# Patient Record
Sex: Female | Born: 1990 | Race: Black or African American | Hispanic: No | Marital: Single | Smoking: Light tobacco smoker
Health system: Southern US, Community
[De-identification: ages and names within clinical notes are randomized; demographics above are authoritative.]

## PROBLEM LIST (undated history)

## (undated) DIAGNOSIS — B019 Varicella without complication: Secondary | ICD-10-CM

## (undated) DIAGNOSIS — E669 Obesity, unspecified: Secondary | ICD-10-CM

## (undated) DIAGNOSIS — S62509A Fracture of unspecified phalanx of unspecified thumb, initial encounter for closed fracture: Secondary | ICD-10-CM

## (undated) DIAGNOSIS — A4902 Methicillin resistant Staphylococcus aureus infection, unspecified site: Secondary | ICD-10-CM

## (undated) HISTORY — DX: Fracture of unspecified phalanx of unspecified thumb, initial encounter for closed fracture: S62.509A

## (undated) HISTORY — DX: Varicella without complication: B01.9

## (undated) HISTORY — DX: Methicillin resistant Staphylococcus aureus infection, unspecified site: A49.02

---
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
## 2011-01-27 LAB — POCT URINALYSIS DIP (DEVICE)
Hgb urine dipstick: NEGATIVE
Protein, ur: 30 — AB
Specific Gravity, Urine: 1.015
Urobilinogen, UA: 1

## 2011-04-14 ENCOUNTER — Ambulatory Visit (INDEPENDENT_AMBULATORY_CARE_PROVIDER_SITE_OTHER): Payer: BC Managed Care – PPO | Admitting: Family Medicine

## 2011-04-14 ENCOUNTER — Encounter: Payer: Self-pay | Admitting: Family Medicine

## 2011-04-14 ENCOUNTER — Telehealth: Payer: Self-pay | Admitting: Internal Medicine

## 2011-04-14 DIAGNOSIS — N912 Amenorrhea, unspecified: Secondary | ICD-10-CM

## 2011-04-14 DIAGNOSIS — G4700 Insomnia, unspecified: Secondary | ICD-10-CM

## 2011-04-14 DIAGNOSIS — D649 Anemia, unspecified: Secondary | ICD-10-CM

## 2011-04-14 LAB — CBC WITH DIFFERENTIAL/PLATELET
Eosinophils Absolute: 0.1 10*3/uL (ref 0.0–0.7)
MCHC: 31.6 g/dL (ref 30.0–36.0)
MCV: 75.1 fl — ABNORMAL LOW (ref 78.0–100.0)
Monocytes Absolute: 0.5 10*3/uL (ref 0.1–1.0)
Neutrophils Relative %: 62 % (ref 43.0–77.0)
Platelets: 427 10*3/uL — ABNORMAL HIGH (ref 150.0–400.0)

## 2011-04-14 LAB — BASIC METABOLIC PANEL
BUN: 10 mg/dL (ref 6–23)
CO2: 26 mEq/L (ref 19–32)
Calcium: 9.1 mg/dL (ref 8.4–10.5)
Creatinine, Ser: 0.7 mg/dL (ref 0.4–1.2)

## 2011-04-14 LAB — POCT URINALYSIS DIPSTICK
Spec Grav, UA: >=1.03
Urobilinogen, UA: 1

## 2011-04-14 LAB — TSH: TSH: 0.81 u[IU]/mL (ref 0.35–5.50)

## 2011-04-14 NOTE — Telephone Encounter (Signed)
Per Dr. Tawanna Cooler pt. Needs to f/u with Dr. Fabian Sharp in one week.

## 2011-04-14 NOTE — Patient Instructions (Signed)
Take one Tylenol PM at bedtime.  No caffeine after 12 noon .  We will get your lab work today.  Set up an office visit next week with Dr. Demetrius Charity
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
## 2011-04-15 LAB — HCG, SERUM, QUALITATIVE: Preg, Serum: NEGATIVE

## 2011-04-17 NOTE — Telephone Encounter (Signed)
pts last ov wellness check with me was in 2010.  Tell her her labs  Are normal except anemia ( she has had )  Need   Treatment and follow up . Unsure but has sometimes  been associated with insomnia also.  Take iron sulfate with vitamin c 250 mg  At the same time twice a day and then plan repeat lab in 3 weeks or so and then office visit . Let us know if this will not work for her.   Labs should be  Fasting insulin level , lipids, LFTS, cbcdiff ,IBC ferritin, vit b12 and folate( dx anemia and amenorrhea and insomnia)  Calendar her periods and bleeding and  bring in to her office visit so I can review.

## 2011-04-18 NOTE — Telephone Encounter (Signed)
Pt aware and orders placed in epic.  

## 2011-05-30 ENCOUNTER — Encounter: Payer: Self-pay | Admitting: Internal Medicine

## 2011-05-30 ENCOUNTER — Ambulatory Visit (INDEPENDENT_AMBULATORY_CARE_PROVIDER_SITE_OTHER): Payer: BC Managed Care – PPO | Admitting: Internal Medicine

## 2011-05-30 VITALS — BP 100/70 | HR 72 | Wt 237.0 lb

## 2011-05-30 DIAGNOSIS — N926 Irregular menstruation, unspecified: Secondary | ICD-10-CM

## 2011-05-30 DIAGNOSIS — G4700 Insomnia, unspecified: Secondary | ICD-10-CM

## 2011-05-30 DIAGNOSIS — E669 Obesity, unspecified: Secondary | ICD-10-CM

## 2011-05-30 DIAGNOSIS — D649 Anemia, unspecified: Secondary | ICD-10-CM

## 2011-05-30 NOTE — Progress Notes (Signed)
  Subjective:    Patient ID: Kaylee Holmes, female    DOB: 1990/06/15, 21 y.o.   MRN: [id number]  HPI PT comes in today for  Fu of visit for insomnia per Dr Tawanna Cooler . She also had labs done and was noted to be anemic. In the meantime taking iron supp with voit c about bid without se. No hx of unusual bleeding  . Period every 3-4 months and last a week.  And somewhat heavy.  Sleep  :  Tired all  Day long but lays down and hard to sleep. No change since on iron.  denies  Anxiety and depression.  NOw in school [employer]   .   For just thhis semester    Minimal screen time  No TV. Except will watch with her mom if she cant sleep.   When cant sleep reads; likes to read. Can fall asleep and then goes to her bed and cant sleep Denies  anxety caffiene etoh or depression. No rls  Review of Systems No cp sob  Cough as per hpi.   Past history family history social history reviewed in the electronic medical record.     Objective:   Physical Exam WDWN in nad looks well Wt Readings from Last 3 Encounters:  05/30/11 237 lb (107.502 kg) (99.13%*)  04/14/11 242 lb (109.77 kg) (99.22%*)  12/29/08 224 lb (101.606 kg) (98.86%*)   * Growth percentiles are based on CDC 2-20 Years data.   wdwn in nad Neck: Supple without adenopathy or masses or bruits. Oriented x 3. Normal cognition, attention, speech. Not anxious or depressed appearing   Good eye contact . Neuro non focal   Lab Results  Component Value Date   WBC 8.6 04/14/2011   HGB 11.6 05/30/2011   HCT 33.6* 04/14/2011   PLT 427.0* 04/14/2011   GLUCOSE 92 04/14/2011   CHOL 149 11/06/2008   TRIG 75 11/06/2008   HDL 35 11/06/2008   LDLCALC 99 11/06/2008   ALT 10 11/06/2008   AST 18 11/06/2008   NA 142 04/14/2011   K 4.4 04/14/2011   CL 108 04/14/2011   CREATININE 0.7 04/14/2011   BUN 10 04/14/2011   CO2 26 04/14/2011   TSH 0.81 04/14/2011    Lab Results  Component Value Date   HGB 11.6 05/30/2011         Assessment & Plan:  Anemia  Presumed iron deficiency   doidnt get labs ahead of time like discussed ireg menses  Insomnia sleep phase? Other problem no obv anx and depression  Total visit > 50% spent counseling and coordinating care   Hand out from UP TO DATE and ehr and review given as above  Then plan fu after  labs in  2 months .  Perhaps reg sced at school will help cycling.  Total visit > 50% spent counseling and coordinating care  Reviewing data with patient

## 2011-05-30 NOTE — Patient Instructions (Addendum)
Avoid back lighting at least an hour before  tryig to go to sleep. The body likes a 24 to 25 hours sleep cycle and  Light and dark influence this.   Could try melatonin if needed.   Some studies show that  This could put your Brain the in right stage for sleep.   Insomnia Insomnia is frequent trouble falling and/or staying asleep. Insomnia can be a long term problem or a short term problem. Both are common. Insomnia can be a short term problem when the wakefulness is related to a certain stress or worry. Long term insomnia is often related to ongoing stress during waking hours and/or poor sleeping habits. Overtime, sleep deprivation itself can make the problem worse. Every little thing feels more severe because you are overtired and your ability to cope is decreased. CAUSES   Stress, anxiety, and depression.   Poor sleeping habits.   Distractions such as TV in the bedroom.   Naps close to bedtime.   Engaging in emotionally charged conversations before bed.   Technical reading before sleep.   Alcohol and other sedatives. They may make the problem worse. They can hurt normal sleep patterns and normal dream activity.   Stimulants such as caffeine for several hours prior to bedtime.   Pain syndromes and shortness of breath can cause insomnia.   Exercise late at night.   Changing time zones may cause sleeping problems (jet lag).  It is sometimes helpful to have someone observe your sleeping patterns. They should look for periods of not breathing during the night (sleep apnea). They should also look to see how long those periods last. If you live alone or observers are uncertain, you can also be observed at a sleep clinic where your sleep patterns will be professionally monitored. Sleep apnea requires a checkup and treatment. Give your caregivers your medical history. Give your caregivers observations your family has made about your sleep.  SYMPTOMS   Not feeling rested in the morning.     Anxiety and restlessness at bedtime.   Difficulty falling and staying asleep.  TREATMENT   Your caregiver may prescribe treatment for an underlying medical disorders. Your caregiver can give advice or help if you are using alcohol or other drugs for self-medication. Treatment of underlying problems will usually eliminate insomnia problems.   Medications can be prescribed for short time use. They are generally not recommended for lengthy use.   Over-the-counter sleep medicines are not recommended for lengthy use. They can be habit forming.   You can promote easier sleeping by making lifestyle changes such as:   Using relaxation techniques that help with breathing and reduce muscle tension.   Exercising earlier in the day.   Changing your diet and the time of your last meal. No night time snacks.   Establish a regular time to go to bed.   Counseling can help with stressful problems and worry.   Soothing music and white noise may be helpful if there are background noises you cannot remove.   Stop tedious detailed work at least one hour before bedtime.  HOME CARE INSTRUCTIONS   Keep a diary. Inform your caregiver about your progress. This includes any medication side effects. See your caregiver regularly. Take note of:   Times when you are asleep.   Times when you are awake during the night.   The quality of your sleep.   How you feel the next day.  This information will help your caregiver care for you.  Get out of bed if you are still awake after 15 minutes. Read or do some quiet activity. Keep the lights down. Wait until you feel sleepy and go back to bed.   Keep regular sleeping and waking hours. Avoid naps.   Exercise regularly.   Avoid distractions at bedtime. Distractions include watching television or engaging in any intense or detailed activity like attempting to balance the household checkbook.   Develop a bedtime ritual. Keep a familiar routine of bathing,  brushing your teeth, climbing into bed at the same time each night, listening to soothing music. Routines increase the success of falling to sleep faster.   Use relaxation techniques. This can be using breathing and muscle tension release routines. It can also include visualizing peaceful scenes. You can also help control troubling or intruding thoughts by keeping your mind occupied with boring or repetitive thoughts like the old concept of counting sheep. You can make it more creative like imagining planting one beautiful flower after another in your backyard garden.   During your day, work to eliminate stress. When this is not possible use some of the previous suggestions to help reduce the anxiety that accompanies stressful situations.  MAKE SURE YOU:   Understand these instructions.   Will watch your condition.   Will get help right away if you are not doing well or get worse.  Document Released: 04/21/2000 Document Revised: 01/04/2011 Document Reviewed: 05/22/2007 [facility] Patient Information 2012 [place], [place].   YOur anemia is improving , Continue the iron twice a day Keep a sleep log as we discussed   >ROV in 2 months or as needed.

## 2011-06-01 ENCOUNTER — Encounter: Payer: Self-pay | Admitting: Internal Medicine

## 2011-06-30 ENCOUNTER — Other Ambulatory Visit: Payer: BC Managed Care – PPO

## 2011-07-05 ENCOUNTER — Other Ambulatory Visit (INDEPENDENT_AMBULATORY_CARE_PROVIDER_SITE_OTHER): Payer: Managed Care, Other (non HMO)

## 2011-07-05 DIAGNOSIS — D649 Anemia, unspecified: Secondary | ICD-10-CM

## 2011-07-05 DIAGNOSIS — G4700 Insomnia, unspecified: Secondary | ICD-10-CM

## 2011-07-05 DIAGNOSIS — N912 Amenorrhea, unspecified: Secondary | ICD-10-CM

## 2011-07-05 LAB — CBC WITH DIFFERENTIAL/PLATELET
Basophils Relative: 0.3 % (ref 0.0–3.0)
Eosinophils Relative: 0.8 % (ref 0.0–5.0)
Lymphocytes Relative: 36.8 % (ref 12.0–46.0)
Monocytes Relative: 4.6 % (ref 3.0–12.0)
Neutrophils Relative %: 57.5 % (ref 43.0–77.0)
RBC: 3.76 Mil/uL — ABNORMAL LOW (ref 3.87–5.11)
WBC: 7.6 10*3/uL (ref 4.5–10.5)

## 2011-07-05 LAB — LIPID PANEL
LDL Cholesterol: 100 mg/dL — ABNORMAL HIGH (ref 0–99)
Total CHOL/HDL Ratio: 4
VLDL: 14.8 mg/dL (ref 0.0–40.0)

## 2011-07-05 LAB — HEPATIC FUNCTION PANEL
AST: 19 U/L (ref 0–37)
Albumin: 3.8 g/dL (ref 3.5–5.2)
Alkaline Phosphatase: 73 U/L (ref 39–117)
Bilirubin, Direct: 0 mg/dL (ref 0.0–0.3)
Total Bilirubin: 0.3 mg/dL (ref 0.3–1.2)

## 2011-07-05 LAB — IBC PANEL
Iron: 15 ug/dL — ABNORMAL LOW (ref 42–145)
Saturation Ratios: 4.1 % — ABNORMAL LOW (ref 20.0–50.0)
Transferrin: 263 mg/dL (ref 212.0–360.0)

## 2011-07-05 LAB — FOLATE: Folate: 6.7 ng/mL (ref >5.9)

## 2011-07-05 LAB — VITAMIN B12: Vitamin B-12: 341 pg/mL (ref 211–911)

## 2011-07-06 LAB — INSULIN, FASTING: Insulin fasting, serum: 34 u[IU]/mL — ABNORMAL HIGH (ref 3–28)

## 2011-07-07 ENCOUNTER — Ambulatory Visit (INDEPENDENT_AMBULATORY_CARE_PROVIDER_SITE_OTHER): Payer: Managed Care, Other (non HMO) | Admitting: Internal Medicine

## 2011-07-07 ENCOUNTER — Encounter: Payer: Self-pay | Admitting: Internal Medicine

## 2011-07-07 ENCOUNTER — Ambulatory Visit: Payer: BC Managed Care – PPO | Admitting: Internal Medicine

## 2011-07-07 DIAGNOSIS — E669 Obesity, unspecified: Secondary | ICD-10-CM

## 2011-07-07 DIAGNOSIS — D649 Anemia, unspecified: Secondary | ICD-10-CM

## 2011-07-07 DIAGNOSIS — N946 Dysmenorrhea, unspecified: Secondary | ICD-10-CM

## 2011-07-07 DIAGNOSIS — N926 Irregular menstruation, unspecified: Secondary | ICD-10-CM

## 2011-07-07 DIAGNOSIS — E8881 Metabolic syndrome: Secondary | ICD-10-CM

## 2011-07-07 DIAGNOSIS — R7309 Other abnormal glucose: Secondary | ICD-10-CM

## 2011-07-07 DIAGNOSIS — G4700 Insomnia, unspecified: Secondary | ICD-10-CM

## 2011-07-07 NOTE — Progress Notes (Signed)
  Subjective:    Patient ID: Kaylee Holmes, female    DOB: 03-11-1991, 21 y.o.   MRN: [id number]  HPI Patient comes in today for follow up of  multiple medical issues.  Anemia; taking iron with vit d bid   . Periods; had heavy on Feb 10 lasted 5 days  Irregular see last note Sleep ; About the same. Wakening  At times.     Hours  Later.  Still hard to fall asleep.  At time.  School tolerable  No new sx  Diet  No soda but ocass gingerale   Juice with meals otherwise water. Review of Systems Neg cp sob fever syncope other bleeding .    Past history family history social history reviewed in the electronic medical record.     Objective:   Physical Exam Well-developed well-nourished in no acute distress Neck: Supple without adenopathy tenderness bruits Abdomen:  Sof,t normal bowel sounds without hepatosplenomegaly, no guarding rebound or masses no CVA tenderness Chest:  Clear to A&P without wheezes rales or rhonchi CV:  S1-S2 no gallops or murmurs peripheral perfusion is normal  Skin on striae acne Lab Results  Component Value Date   WBC 7.6 07/05/2011   HGB 9.4* 07/05/2011   HCT 29.6* 07/05/2011   PLT 423.0* 07/05/2011   GLUCOSE 92 04/14/2011   CHOL 149 07/05/2011   TRIG 74.0 07/05/2011   HDL 34.10* 07/05/2011   LDLCALC 100* 07/05/2011   ALT 12 07/05/2011   AST 19 07/05/2011   NA 142 04/14/2011   K 4.4 04/14/2011   CL 108 04/14/2011   CREATININE 0.7 04/14/2011   BUN 10 04/14/2011   CO2 26 04/14/2011   TSH 0.81 04/14/2011   INsulin levels  34   Low IBC.        Assessment & Plan:  Anemia    Worse   Taking iron bid     Increase    To tid  Lab c/w iron deficiency . Irreg periods and heavy when comes   Insulin resistance  Poss pco syndrome Low HDL Obesity Disc with loss  . As important.   Challenge her to stop juices any sweet beverages altogether although she's doing pretty well with this otherwise. Consideration of metformin and/or hormonal therapy. We will get gynecologic referral  opinion about intervention. Have her followup with me in 3 months  Insomnia  uncertain factors except previously noted.

## 2011-07-07 NOTE — Patient Instructions (Addendum)
Your anemia is a bit worse.  Take iron twice to 3 x per day  a day with vitamin c pills 250 mg .  You have some insulin resistance that can be seen inf prediabetes and or Polycystic ovary syndrome.  Would like to see   A gynecologist   Consider meds such as metformin or hormonal treatments.  Weight loss will help the above problem  also.   It is possible that anemia could contribute to the sleep issue but stress can also .Polycystic Ovarian Syndrome Polycystic ovarian syndrome is a condition with a number of problems. One problem is with the ovaries. The ovaries are organs located in the female pelvis, on each side of the uterus. Usually, during the menstrual cycle, an egg is released from 1 ovary every month. This is called ovulation. When the egg is fertilized, it goes into the womb (uterus), which allows for the growth of a baby. The egg travels from the ovary through the fallopian tube to the uterus. The ovaries also make the hormones estrogen and progesterone. These hormones help the development of a woman's breasts, body shape, and body hair. They also regulate the menstrual cycle and pregnancy. Sometimes, cysts form in the ovaries. A cyst is a fluid-filled sac. On the ovary, different types of cysts can form. The most common type of ovarian cyst is called a functional or ovulation cyst. It is normal, and often forms during the normal menstrual cycle. Each month, a woman's ovaries grow tiny cysts that hold the eggs. When an egg is fully grown, the sac breaks open. This releases the egg. Then, the sac which released the egg from the ovary dissolves. In one type of functional cyst, called a follicle cyst, the sac does not break open to release the egg. It may actually continue to grow. This type of cyst usually disappears within 1 to 3 months.  One type of cyst problem with the ovaries is called Polycystic Ovarian Syndrome (PCOS). In this condition, many follicle cysts form, but do not rupture and  produce an egg. This health problem can affect the following:  Menstrual cycle.   Heart.   Obesity.   Cancer of the uterus.   Fertility.   Blood vessels.   Hair growth (face and body) or baldness.   Hormones.   Appearance.   High blood pressure.   Stroke.   Insulin production.   Inflammation of the liver.   Elevated blood cholesterol and triglycerides.  CAUSES   No one knows the exact cause of PCOS.   Women with PCOS often have a mother or sister with PCOS. There is not yet enough proof to say this is inherited.   Many women with PCOS have a weight problem.   Researchers are looking at the relationship between PCOS and the body's ability to make insulin. Insulin is a hormone that regulates the change of sugar, starches, and other food into energy for the body's use, or for storage. Some women with PCOS make too much insulin. It is possible that the ovaries react by making too many female hormones, called androgens. This can lead to acne, excessive hair growth, weight gain, and ovulation problems.   Too much production of luteinizing hormone (LH) from the pituitary gland in the brain stimulates the ovary to produce too much female hormone (androgen).  SYMPTOMS   Infrequent or no menstrual periods, and/or irregular bleeding.   Inability to get pregnant (infertility), because of not ovulating.   Increased growth of  hair on the face, chest, stomach, back, thumbs, thighs, or toes.   Acne, oily skin, or dandruff.   Pelvic pain.   Weight gain or obesity, usually carrying extra weight around the waist.   Type 2 diabetes (this is the diabetes that usually does not need insulin).   High cholesterol.   High blood pressure.   Female-pattern baldness or thinning hair.   Patches of thickened and dark brown or black skin on the neck, arms, breasts, or thighs.   Skin tags, or tiny excess flaps of skin, in the armpits or neck area.   Sleep apnea (excessive snoring and  breathing stops at times while asleep).   Deepening of the voice.   Gestational diabetes when pregnant.   Increased risk of miscarriage with pregnancy.  DIAGNOSIS  There is no single test to diagnose PCOS.   Your caregiver will:   Take a medical history.   Perform a pelvic exam.   Perform an ultrasound.   Check your female and female hormone levels.   Measure glucose or sugar levels in the blood.   Do other blood tests.   If you are producing too many female hormones, your caregiver will make sure it is from PCOS. At the physical exam, your caregiver will want to evaluate the areas of increased hair growth. Try to allow natural hair growth for a few days before the visit.   During a pelvic exam, the ovaries may be enlarged or swollen by the increased number of small cysts. This can be seen more easily by vaginal ultrasound or screening, to examine the ovaries and lining of the uterus (endometrium) for cysts. The uterine lining may become thicker, if there has not been a regular period.  TREATMENT  Because there is no cure for PCOS, it needs to be managed to prevent problems. Treatments are based on your symptoms. Treatment is also based on whether you want to have a baby or whether you need contraception.  Treatment may include:  Progesterone hormone, to start a menstrual period.   Birth control pills, to make you have regular menstrual periods.   Medicines to make you ovulate, if you want to get pregnant.   Medicines to control your insulin.   Medicine to control your blood pressure.   Medicine and diet, to control your high cholesterol and triglycerides in your blood.   Surgery, making small holes in the ovary, to decrease the amount of female hormone production. This is done through a long, lighted tube (laparoscope), placed into the pelvis through a tiny incision in the lower abdomen.  Your caregiver will go over some of the choices with you. WOMEN WITH PCOS HAVE THESE  CHARACTERISTICS:  High levels of female hormones called androgens.   An irregular or no menstrual cycle.   May have many small cysts in their ovaries.  PCOS is the most common hormonal reproductive problem in women of childbearing age. WHY DO WOMEN WITH PCOS HAVE TROUBLE WITH THEIR MENSTRUAL CYCLE? Each month, about 20 eggs start to mature in the ovaries. As one egg grows and matures, the follicle breaks open to release the egg, so it can travel through the fallopian tube for fertilization. When the single egg leaves the follicle, ovulation takes place. In women with PCOS, the ovary does not make all of the hormones it needs for any of the eggs to fully mature. They may start to grow and accumulate fluid, but no one egg becomes large enough. Instead, some may remain  as cysts. Since no egg matures or is released, ovulation does not occur and the hormone progesterone is not made. Without progesterone, a woman's menstrual cycle is irregular or absent. Also, the cysts produce female hormones, which continue to prevent ovulation.  Document Released: 08/18/2004 Document Revised: 01/04/2011 Document Reviewed: 03/12/2009 [facility] Patient Information 2012 [place], [place].Insulin Resistance Glucose (type of sugar) levels are controlled by a hormone called insulin. Insulin is made by your pancreas. When your blood glucose goes up, insulin is released into your blood. Insulin is required for your body to function normally. However, your body can become resistant your own insulin or to insulin given to treat diabetes. In either case, insulin resistance can lead to serious problems. Taken together, these problems are called Insulin Resistance Syndrome, Metabolic Syndrome, Syndrome X, or Dysmetabolic Syndrome. These problems include:  Diabetes Mellitus Type 2.   Heart disease.   High blood pressure.   Stroke.   Polycystic ovary syndrome.   Fatty liver.  CAUSES  Insulin resistance can develop for many  different reasons. It is more likely to happen in people with these conditions or characteristics:  Obesity.   Pregnancy. This includes gestational diabetes (temporary diabetes during pregnancy).   Family history of Diabetes Type 2.   High blood pressure.   Stress of severe illness.   Corticosteroid treatment.   Polycystic ovarian syndrome.   Non-Caucasion race.   Cardiovascular disease.   A darkening and thickening of the skin in fold areas such as the neckline and armpit (acanthosis nigricans).  SYMPTOMS  There are no symptoms. You may have symptoms related to the various complications of insulin resistance.  DIAGNOSIS  Several different things can make your caregiver suspect you have insulin resistance,. These include:  High blood glucose.   Abnormal cholesterol levels.   High uric acid levels.   Changes related to blood pressure.   Changes related to inflammation.   Presence of acanthosis nigricans (see above).  Insulin resistance can be determined with blood tests. An elevated insulin level when you have not eaten might suggest resistance. Other more complicated tests are sometimes necessary. TREATMENT  Lifestyle changes are the most important treatment for insulin resistance.   If you are overweight and you have insulin resistance, you can improve your insulin sensitivity by loosing 10 to 15 % of your weight.   Moderate exercise for 30 to 40 minutes, 4 days a week, can improve insulin sensitivity.  Some medications can also help improve your insulin sensitivity. Your caregiver can discuss these with you if they are appropriate.  HOME CARE INSTRUCTIONS   Do not smoke.   Keep your weight at a healthy level.   Get exercise.   If you have diabetes, follow your caregiver's directions.   If you have high blood pressure, follow your caregiver's directions.   Take prescribed medications according to the directions.  SEEK MEDICAL CARE IF:   You are diabetic,  and you are having problems keeping your blood sugar at target range.   You are having episodes of hypoglycemia.   You feel you might be having side effects from your medicines.   You have symptoms of an illness that is not improving after 3 to 4 days.   You have a sore or wound that is not healing.   You notice a change in vision or a new problem with your vision.   You develop fever of more than 100.5 F (38.1 C).  SEEK IMMEDIATE MEDICAL CARE IF:   Your  blood sugar goes below 70, especially if you have confusion, lightheadedness or other symptoms with it.   Your blood sugar is very high (as advised by your caregiver) twice in a row.   An unexplained oral temperature above 102.0 F (38.9 C) develops.   You pass out.   You have chest pain and/or trouble breathing.   You have a sudden, severe headache   You have sudden weakness in one arm and/or one leg.   You have sudden difficulty speaking and/or swallowing.   You develop vomiting and/or diarrhea that is getting worse or not improving after 1 day.  Document Released: 06/13/2005 Document Revised: 01/04/2011 Document Reviewed: 06/11/2008 [facility] Patient Information 2012 [place], [place].

## 2011-07-10 ENCOUNTER — Ambulatory Visit: Payer: Self-pay | Admitting: Internal Medicine

## 2011-08-14 ENCOUNTER — Telehealth: Payer: Self-pay | Admitting: Internal Medicine

## 2011-08-14 NOTE — Telephone Encounter (Signed)
Pls advise.  

## 2011-08-14 NOTE — Telephone Encounter (Signed)
Suggest we do OV here and  Obtain  GYNE note and any labs that they did . Before the visit so I can review.   (Already know  that she has insulin resistance with  a prediabetic state. )

## 2011-08-14 NOTE — Telephone Encounter (Signed)
Pts mom called and said that her daughter was dx with diabetes by pts obgyn, Dr Ezzard Standing. Pt is suppose to be referred to an Endocrinologists, but pts mom wants to know if Dr Fabian Sharp could be her attending physician for this matter? If so, pt has ov to see Dr Fabian Sharp in June and pts mom is wondering if pt needs to get in sooner than this?

## 2011-08-15 NOTE — Telephone Encounter (Signed)
Pt aware.

## 2011-08-18 ENCOUNTER — Ambulatory Visit (INDEPENDENT_AMBULATORY_CARE_PROVIDER_SITE_OTHER): Payer: BC Managed Care – PPO | Admitting: Internal Medicine

## 2011-08-18 ENCOUNTER — Encounter: Payer: Self-pay | Admitting: Internal Medicine

## 2011-08-18 VITALS — BP 114/80 | HR 100 | Temp 98.5°F | Wt 241.0 lb

## 2011-08-18 DIAGNOSIS — E8881 Metabolic syndrome: Secondary | ICD-10-CM

## 2011-08-18 DIAGNOSIS — E669 Obesity, unspecified: Secondary | ICD-10-CM

## 2011-08-18 DIAGNOSIS — N926 Irregular menstruation, unspecified: Secondary | ICD-10-CM

## 2011-08-18 DIAGNOSIS — Z68.41 Body mass index (BMI) pediatric, greater than or equal to 95th percentile for age: Secondary | ICD-10-CM

## 2011-08-18 DIAGNOSIS — D649 Anemia, unspecified: Secondary | ICD-10-CM

## 2011-08-18 DIAGNOSIS — Z6841 Body Mass Index (BMI) 40.0 and over, adult: Secondary | ICD-10-CM

## 2011-08-18 LAB — CBC WITH DIFFERENTIAL/PLATELET
Basophils Relative: 0.5 % (ref 0.0–3.0)
Eosinophils Relative: 1.3 % (ref 0.0–5.0)
HCT: 34 % — ABNORMAL LOW (ref 36.0–46.0)
Hemoglobin: 10.8 g/dL — ABNORMAL LOW (ref 12.0–15.0)
Lymphs Abs: 3 10*3/uL (ref 0.7–4.0)
MCV: 79 fl (ref 78.0–100.0)
Monocytes Absolute: 0.4 10*3/uL (ref 0.1–1.0)
Monocytes Relative: 4.9 % (ref 3.0–12.0)
RBC: 4.3 Mil/uL (ref 3.87–5.11)
WBC: 7.7 10*3/uL (ref 4.5–10.5)

## 2011-08-18 LAB — BASIC METABOLIC PANEL
CO2: 27 mEq/L (ref 19–32)
GFR: 171.34 mL/min (ref >60.00)
Glucose, Bld: 94 mg/dL (ref 70–99)
Potassium: 4.7 mEq/L (ref 3.5–5.1)
Sodium: 141 mEq/L (ref 135–145)

## 2011-08-18 LAB — HEMOGLOBIN A1C: Hgb A1c MFr Bld: 5.4 % (ref 4.6–6.5)

## 2011-08-18 MED ORDER — METFORMIN HCL ER 500 MG PO TB24: 500.0000 mg | ORAL_TABLET | Freq: Every day | ORAL | Status: DC

## 2011-08-18 NOTE — Patient Instructions (Addendum)
Stop all sugar or sweet beverages including juices. Will arrange a nutrition referral  Can begin metformin 500 once a day. Have a small protein breakfast  .    Check sugars at least 3 x per week fasting and 3 x per week 2-3 hours after a meal and record.  Continue iron until further notice   Plan ROV depending on labs or 3 months

## 2011-08-19 ENCOUNTER — Encounter: Payer: Self-pay | Admitting: Internal Medicine

## 2011-08-19 DIAGNOSIS — Z68.41 Body mass index (BMI) pediatric, greater than or equal to 95th percentile for age: Secondary | ICD-10-CM | POA: Insufficient documentation

## 2011-08-19 DIAGNOSIS — IMO0002 Reserved for concepts with insufficient information to code with codable children: Secondary | ICD-10-CM | POA: Insufficient documentation

## 2011-08-19 NOTE — Progress Notes (Signed)
  Subjective:    Patient ID: Kaylee Holmes, female    DOB: 1990/09/07, 21 y.o.   MRN: [id number]  HPI Comes in today with mom because gyne said she had dm and wanted to refer her to endocrine and mom asks abou tpcp managing at this time.  Dr Ezzard Standing.   REported that irreg periods not felt to be significant for her age. Since last time she has somewhat changed her diet and checking her bgs also  Usually in range   highest 139   Has decrese all sodas but does drink juice instead   Anemia taking iron daily without issue . Last check in feb ?   Review of Systems  no change no fever cp sob syncope vision change pr numbness  Mom had DM controlled with LSI  Sib with thyroid and depression and   obesity    Objective:   Physical Exam BP 114/80  Pulse 100  Temp(Src) 98.5 F (36.9 C) (Oral)  Wt 241 lb (109.317 kg)  SpO2 99%  LMP 08/03/2011 wdwn in nad  Neck palpable thyroid  No adenopathy some  Darkening Chest:  Clear to A&P without wheezes rales or rhonchi CV:  S1-S2 no gallops or murmurs peripheral perfusion is normal Abdomen:  Sof,t normal bowel sounds without hepatosplenomegaly, no guarding rebound or masses no CVA tenderness  Glucometer check most  Are 120 or below  ocass 130 range  Fasting and non fasting numbers today.    Assessment & Plan:   Insulin resistance     No dx of diabetes  At this time however do not have labs from gyne . However she is pre diabetic and insulin resistant .    Agree in  Intensive lsi   And refer for nutrition counseling .  disc importance of avoiding all sugar based drinks  And not to drink  Her calories.  Whether to add on metformin at this time  Is an option   Although will not change the  Course of the condition if no change in lsi  And weight loss.  Disc this with mom and teen. Will rx med and fu and get labs today. At this time no need to refer to endo unless not making progress or other abnormality  But need copy of labs and notes.   Anemia  Iron  defic   Will recheck today .  irreg menses  Sometimes can regulate with metformin .  Last lmp lasted 4-5 days and more normal.  Elevated BMI and obesity.

## 2011-08-22 NOTE — Progress Notes (Signed)
Quick Note:  Pt aware ______ 

## 2011-08-28 ENCOUNTER — Emergency Department (INDEPENDENT_AMBULATORY_CARE_PROVIDER_SITE_OTHER)
Admission: EM | Admit: 2011-08-28 | Discharge: 2011-08-28 | Disposition: A | Discharge status: Home / Self Care | Payer: BC Managed Care – PPO | Source: Home / Self Care | Attending: Emergency Medicine | Admitting: Emergency Medicine

## 2011-08-28 ENCOUNTER — Encounter (HOSPITAL_COMMUNITY): Payer: Self-pay | Admitting: Emergency Medicine

## 2011-08-28 DIAGNOSIS — M7700 Medial epicondylitis, unspecified elbow: Secondary | ICD-10-CM

## 2011-08-28 MED ORDER — MELOXICAM 7.5 MG PO TABS: 7.5000 mg | ORAL_TABLET | Freq: Every day | ORAL | Status: AC

## 2011-08-28 NOTE — ED Notes (Addendum)
Pt here with sudden left arm pain and diff to fully flex or bend extrem,worsens with hand grasp that started x 2weeks but has gotten worse.pt denies injury or strain.hx diabetes and takes metformin 500mg  bid.pt has been taking otc pain relievers but not working.c/o constant stinging pain that radiates to hand making difficult to grip

## 2011-08-28 NOTE — Discharge Instructions (Signed)
As discussed use prescribed medicine for your symptoms. Should also by an elbow sleeve and use it for 3-5 days if discomfort persists beyond 7-10 days should followup with the orthopedic Dr. as discussed.     Medial Epicondylitis (Golfer's Elbow) with Rehab Medial epicondylitis involves inflammation and pain around the inner (medial) portion of the elbow. This pain is caused by inflammation of the tendons in the forearm that flex (bring down) the wrist. Medial epicondylitis is also called golfer's elbow, because it is common among golfers. However, it may occur in any individual who flexes the wrist regularly. If medial epicondylitis is left untreated, it may become a chronic problem. SYMPTOMS   Pain, tenderness, or inflammation over the inner (medial) side of the elbow.   Pain or weakness with gripping activities.   Pain that increases with wrist twisting motions (using a screwdriver, playing golf, bowling).  CAUSES  Medial epicondylitis is caused by inflammation of the tendons that flex the wrist. Causes of injury may include:  Chronic, repetitive stress and strain to the tendons that run from the wrist and forearm to the elbow.   Sudden strain on the forearm, including wrist snap when serving balls with racquet sports, or throwing a baseball.  RISK INCREASES WITH:  Sports or occupations that require repetitive and/or strenuous forearm and wrist movements (pitching a baseball, golfing, carpentry).   Poor wrist and forearm strength and flexibility.   Failure to warm up properly before activity.   Resuming activity before healing, rehabilitation, and conditioning are complete.  PREVENTION   Warm up and stretch properly before activity.   Maintain physical fitness:   Strength, flexibility, and endurance.   Cardiovascular fitness.   Wear and use properly fitted equipment.   Learn and use proper technique and have a coach correct improper technique.   Wear a tennis elbow  (counterforce) brace.  PROGNOSIS  The course of this condition depends on the degree of the injury. If treated properly, acute cases (symptoms lasting less than 4 weeks) are often resolved in 2 to 6 weeks. Chronic (longer lasting cases) often resolve in 3 to 6 months, but may require physical therapy. RELATED COMPLICATIONS   Frequently recurring symptoms, resulting in a chronic problem. Properly treating the problem the first time decreases frequency of recurrence.   Chronic inflammation, scarring, and partial tendon tear, requiring surgery.   Delayed healing or resolution of symptoms.  TREATMENT  Treatment first involves the use of ice and medicine, to reduce pain and inflammation. Strengthening and stretching exercises may reduce discomfort, if performed regularly. These exercises may be performed at home, if the condition is an acute injury. Chronic cases may require a referral to a physical therapist for evaluation and treatment. Your caregiver may advise a corticosteroid injection to help reduce inflammation. Rarely, surgery is needed. MEDICATION  If pain medicine is needed, nonsteroidal anti-inflammatory medicines (aspirin and ibuprofen), or other minor pain relievers (acetaminophen), are often advised.   Do not take pain medicine for 7 days before surgery.   Prescription pain relievers may be given, if your caregiver thinks they are needed. Use only as directed and only as much as you need.   Corticosteroid injections may be recommended. These injections should be reserved only for the most severe cases, because they can only be given a certain number of times.  HEAT AND COLD  Cold treatment (icing) should be applied for 10 to 15 minutes every 2 to 3 hours for inflammation and pain, and immediately after activity that  aggravates your symptoms. Use ice packs or an ice massage.   Heat treatment may be used before performing stretching and strengthening activities prescribed by your  caregiver, physical therapist, or athletic trainer. Use a heat pack or a warm water soak.  SEEK MEDICAL CARE IF: Symptoms get worse or do not improve in 2 weeks, despite treatment. EXERCISES  RANGE OF MOTION (ROM) AND STRETCHING EXERCISES - Epicondylitis, Medial (Golfer's Elbow) These exercises may help you when beginning to rehabilitate your injury. Your symptoms may go away with or without further involvement from your physician, physical therapist or athletic trainer. While completing these exercises, remember:   Restoring tissue flexibility helps normal motion to return to the joints. This allows healthier, less painful movement and activity.   An effective stretch should be held for at least 30 seconds.   A stretch should never be painful. You should only feel a gentle lengthening or release in the stretched tissue.  RANGE OF MOTION - Wrist Flexion, Active-Assisted  Extend your right / left elbow with your fingers pointing down.*   Gently pull the back of your hand towards you, until you feel a gentle stretch on the top of your forearm.   Hold this position for __________ seconds.  Repeat __________ times. Complete this exercise __________ times per day.  *If directed by your physician, physical therapist or athletic trainer, complete this stretch with your elbow bent, rather than extended. RANGE OF MOTION - Wrist Extension, Active-Assisted  Extend your right / left elbow and turn your palm upwards.*   Gently pull your palm and fingertips back, so your wrist extends and your fingers point more toward the ground.   You should feel a gentle stretch on the inside of your forearm.   Hold this position for __________ seconds.  Repeat __________ times. Complete this exercise __________ times per day. *If directed by your physician, physical therapist or athletic trainer, complete this stretch with your elbow bent, rather than extended. STRETCH - Wrist Extension   Place your right /  left fingertips on a tabletop leaving your elbow slightly bent. Your fingers should point backwards.   Gently press your fingers and palm down onto the table, by straightening your elbow. You should feel a stretch on the inside of your forearm.   Hold this position for __________ seconds.  Repeat __________ times. Complete this stretch __________ times per day.  STRENGTHENING EXERCISES - Epicondylitis, Medial (Golfer's Elbow) These exercises may help you when beginning to rehabilitate your injury. They may resolve your symptoms with or without further involvement from your physician, physical therapist or athletic trainer. While completing these exercises, remember:   Muscles can gain both the endurance and the strength needed for everyday activities through controlled exercises.   Complete these exercises as instructed by your physician, physical therapist or athletic trainer. Increase the resistance and repetitions only as guided.   You may experience muscle soreness or fatigue, but the pain or discomfort you are trying to eliminate should never worsen during these exercises. If this pain does get worse, stop and make sure you are following the directions exactly. If the pain is still present after adjustments, discontinue the exercise until you can discuss the trouble with your caregiver.  STRENGTH - Wrist Flexors  Sit with your right / left forearm palm-up, and fully supported on a table or countertop. Your elbow should be resting below the height of your shoulder. Allow your wrist to extend over the edge of the surface.  Loosely holding a __________ weight, or a piece of rubber exercise band or tubing, slowly curl your hand up toward your forearm.   Hold this position for __________ seconds. Slowly lower the wrist back to the starting position in a controlled manner.  Repeat __________ times. Complete this exercise __________ times per day.  STRENGTH - Wrist Extensors  Sit with your  right / left forearm palm-down and fully supported. Your elbow should be resting below the height of your shoulder. Allow your wrist to extend over the edge of the surface.   Loosely holding a __________ weight, or a piece of rubber exercise band or tubing, slowly curl your hand up toward your forearm.   Hold this position for __________ seconds. Slowly lower the wrist back to the starting position in a controlled manner.  Repeat __________ times. Complete this exercise __________ times per day.  STRENGTH - Ulnar Deviators  Stand with a ____________________ weight in your right / left hand, or sit while holding a rubber exercise band or tubing, with your healthy arm supported on a table or countertop.   Move your wrist so that your pinkie travels toward your forearm and your thumb moves away from your forearm.   Hold this position for __________ seconds and then slowly lower the wrist back to the starting position.  Repeat __________ times. Complete this exercise __________ times per day STRENGTH - Grip   Grasp a tennis ball, a dense sponge, or a large, rolled sock in your hand.   Squeeze as hard as you can, without increasing any pain.   Hold this position for __________ seconds. Release your grip slowly.  Repeat __________ times. Complete this exercise __________ times per day.  STRENGTH - Forearm Supinators   Sit with your right / left forearm supported on a table, keeping your elbow below shoulder height. Rest your hand over the edge, palm down.   Gently grip a hammer or a soup ladle.   Without moving your elbow, slowly turn your palm and hand upward to a "thumbs-up" position.   Hold this position for __________ seconds. Slowly return to the starting position.  Repeat __________ times. Complete this exercise __________ times per day.  STRENGTH - Forearm Pronators  Sit with your right / left forearm supported on a table, keeping your elbow below shoulder height. Rest your hand  over the edge, palm up.   Gently grip a hammer or a soup ladle.   Without moving your elbow, slowly turn your palm and hand upward to a "thumbs-up" position.   Hold this position for __________ seconds. Slowly return to the starting position.  Repeat __________ times. Complete this exercise __________ times per day.  Document Released: 04/24/2005 Document Revised: 04/13/2011 Document Reviewed: 08/06/2008 [facility] Patient Information 2012 [place], [place].

## 2011-08-28 NOTE — ED Provider Notes (Signed)
History     CSN: [id number]  Arrival date & time 08/28/11  5621   First MD Initiated Contact with Patient 08/28/11 [phone]      Chief Complaint  Patient presents with  . Arm Pain    (Consider location/radiation/quality/duration/timing/severity/associated sxs/prior treatment) HPI Comments: For 2 weeks her L elbow and lower arm forearm its hurting to simply move he arm, it even hurts to grasp things with her hand, it like the pain. No numbness and no tiilging.  Patient is a 21 y.o. female presenting with arm pain. The history is provided by the patient.  Arm Pain This is a new problem. The current episode started more than 1 week ago. The problem occurs constantly. The problem has not changed since onset.Pertinent negatives include no chest pain, no headaches and no shortness of breath. The symptoms are aggravated by twisting. The symptoms are relieved by nothing. She has tried acetaminophen and a cold compress for the symptoms. The treatment provided no relief.    Past Medical History  Diagnosis Date  . MRSA (methicillin resistant Staphylococcus aureus)     recurrent skin sores   . Thumb fracture     left  . Chicken pox   . 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 Diabetes mellitus     Past Surgical History  Procedure Date  . Spinal fusion 2010    Family History  Problem Relation Age of Onset  . Ulcerative colitis Father   . Rashes / Skin problems Father     psoriasis  . Sarcoidosis Father     skin  . Asthma Other   . Allergies Other   . Rashes / Skin problems Other     aczema  . Diabetes Other     History  Substance Use Topics  . Smoking status: Former Games developer  . Smokeless tobacco: Not on file  . Alcohol Use: No    OB History    Grav Para Term Preterm Abortions TAB SAB Ect Mult Living                  Review of Systems  Constitutional: Negative for fever, appetite change and fatigue.  HENT:  Negative for congestion and dental problem.   Respiratory: Negative for chest tightness, shortness of breath and wheezing.   Cardiovascular: Negative for chest pain.  Gastrointestinal: Negative for abdominal distention.  Musculoskeletal: Positive for back pain.  Neurological: Negative for headaches.    Allergies  Sulfamethoxazole w/trimethoprim  Home Medications   Current Outpatient Rx  Name Route Sig Dispense Refill  . METFORMIN HCL ER 500 MG PO TB24 Oral Take 1 tablet (500 mg total) by mouth daily with breakfast. 30 tablet 5  . IBUPROFEN 200 MG PO TABS Oral Take 200 mg by mouth every 6 (six) hours as needed.    . MELOXICAM 7.5 MG PO TABS Oral Take 1 tablet (7.5 mg total) by mouth daily. 14 tablet 0    BP 103/73  Pulse 87  Temp(Src) 98.2 F (36.8 C) (Oral)  Resp 16  SpO2 100%  LMP 08/03/2011  Physical Exam  Constitutional: She appears well-developed and well-nourished. No distress.  Pulmonary/Chest: Effort normal and breath sounds normal.  Musculoskeletal: She exhibits tenderness.       Arms: Neurological: She is alert. No cranial nerve deficit. Coordination normal.  Skin: Skin is warm. No erythema.    ED Course  Procedures (including critical care time)  Labs Reviewed - No data to display No results found.   1. Epicondylitis elbow, medial       MDM  No neuromuscular, deficits. ABLE TO FLEX AND EXTEND  L elbow. No observed STS tenderness its elicited on both lateral  and medial epicondylitis. Able to adduct and abduct fingers and extension and flexion of wrist. No evidence of infection or constitutional symptoms, unlikely infectious process        Jimmie Molly, MD 08/28/11 1754

## 2011-08-30 ENCOUNTER — Encounter (HOSPITAL_BASED_OUTPATIENT_CLINIC_OR_DEPARTMENT_OTHER): Payer: Self-pay | Admitting: Emergency Medicine

## 2011-08-30 ENCOUNTER — Emergency Department (HOSPITAL_BASED_OUTPATIENT_CLINIC_OR_DEPARTMENT_OTHER)
Admission: EM | Admit: 2011-08-30 | Discharge: 2011-08-31 | Disposition: A | Discharge status: Home / Self Care | Payer: BC Managed Care – PPO | Attending: Emergency Medicine | Admitting: Emergency Medicine

## 2011-08-30 ENCOUNTER — Emergency Department (INDEPENDENT_AMBULATORY_CARE_PROVIDER_SITE_OTHER): Payer: BC Managed Care – PPO

## 2011-08-30 DIAGNOSIS — E119 Type 2 diabetes mellitus without complications: Secondary | ICD-10-CM | POA: Insufficient documentation

## 2011-08-30 DIAGNOSIS — X58XXXA Exposure to other specified factors, initial encounter: Secondary | ICD-10-CM | POA: Insufficient documentation

## 2011-08-30 DIAGNOSIS — W19XXXA Unspecified fall, initial encounter: Secondary | ICD-10-CM

## 2011-08-30 DIAGNOSIS — Y9239 Other specified sports and athletic area as the place of occurrence of the external cause: Secondary | ICD-10-CM | POA: Insufficient documentation

## 2011-08-30 DIAGNOSIS — S93402A Sprain of unspecified ligament of left ankle, initial encounter: Secondary | ICD-10-CM

## 2011-08-30 DIAGNOSIS — M25579 Pain in unspecified ankle and joints of unspecified foot: Secondary | ICD-10-CM

## 2011-08-30 DIAGNOSIS — S93409A Sprain of unspecified ligament of unspecified ankle, initial encounter: Secondary | ICD-10-CM | POA: Insufficient documentation

## 2011-08-30 DIAGNOSIS — Z87891 Personal history of nicotine dependence: Secondary | ICD-10-CM | POA: Insufficient documentation

## 2011-08-30 HISTORY — DX: Obesity, unspecified: E66.9

## 2011-08-30 NOTE — ED Notes (Signed)
Pt c/o left ankle pain after turning ankle over at the gym

## 2011-08-31 MED ORDER — IBUPROFEN 400 MG PO TABS
400.0000 mg | ORAL_TABLET | Freq: Once | ORAL | Status: AC
  Administered 2011-08-31: 400 mg via ORAL
  Filled 2011-08-31: qty 1

## 2011-08-31 MED ORDER — IBUPROFEN 400 MG PO TABS: ORAL_TABLET | ORAL | Status: DC

## 2011-08-31 NOTE — Discharge Instructions (Signed)
Ankle Sprain An ankle sprain is an injury to the strong, fibrous tissues (ligaments) that hold the bones of your ankle joint together.  CAUSES Ankle sprain usually is caused by a fall or by twisting your ankle. People who participate in sports are more prone to these types of injuries.  SYMPTOMS  Symptoms of ankle sprain include:  Pain in your ankle. The pain may be present at rest or only when you are trying to stand or walk.   Swelling.   Bruising. Bruising may develop immediately or within 1 to 2 days after your injury.   Difficulty standing or walking.  DIAGNOSIS  Your caregiver will ask you details about your injury and perform a physical exam of your ankle to determine if you have an ankle sprain. During the physical exam, your caregiver will press and squeeze specific areas of your foot and ankle. Your caregiver will try to move your ankle in certain ways. An X-ray exam may be done to be sure a bone was not broken or a ligament did not separate from one of the bones in your ankle (avulsion).  TREATMENT  Certain types of braces can help stabilize your ankle. Your caregiver can make a recommendation for this. Your caregiver may recommend the use of medication for pain. If your sprain is severe, your caregiver may refer you to a surgeon who helps to restore function to parts of your skeletal system (orthopedist) or a physical therapist. HOME CARE INSTRUCTIONS  Apply ice to your injury for 1 to 2 days or as directed by your caregiver. Applying ice helps to reduce inflammation and pain.  Put ice in a plastic bag.   Place a towel between your skin and the bag.   Leave the ice on for 15 to 20 minutes at a time, every 2 hours while you are awake.   Take over-the-counter or prescription medicines for pain, discomfort, or fever only as directed by your caregiver.   Keep your injured leg elevated, when possible, to lessen swelling.   If your caregiver recommends crutches, use them as  instructed. Gradually, put weight on the affected ankle. Continue to use crutches or a cane until you can walk without feeling pain in your ankle.   If you have a plaster splint, wear the splint as directed by your caregiver. Do not rest it on anything harder than a pillow the first 24 hours. Do not put weight on it. Do not get it wet. You may take it off to take a shower or bath.   You may have been given an elastic bandage to wear around your ankle to provide support. If the elastic bandage is too tight (you have numbness or tingling in your foot or your foot becomes cold and blue), adjust the bandage to make it comfortable.   If you have an air splint, you may blow more air into it or let air out to make it more comfortable. You may take your splint off at night and before taking a shower or bath.   Wiggle your toes in the splint several times per day if you are able.  SEEK MEDICAL CARE IF:   You have an increase in bruising, swelling, or pain.   Your toes feel cold.   Pain relief is not achieved with medication.  SEEK IMMEDIATE MEDICAL CARE IF: Your toes are numb or blue or you have severe pain. MAKE SURE YOU:   Understand these instructions.   Will watch your condition.     Will get help right away if you are not doing well or get worse.  Document Released: 04/24/2005 Document Revised: 04/13/2011 Document Reviewed: 11/27/2007 [employer] Patient Information 2012 [employer]. 

## 2011-08-31 NOTE — ED Provider Notes (Signed)
History     CSN: [id number]  Arrival date & time 08/30/11  2205   First MD Initiated Contact with Patient 08/31/11 0007      Chief Complaint  Patient presents with  . Ankle Injury    (Consider location/radiation/quality/duration/timing/severity/associated sxs/prior treatment) Patient is a 21 y.o. female presenting with lower extremity injury. The history is provided by the patient. No language interpreter was used.  Ankle Injury This is a new problem. The current episode started 1 to 2 hours ago (Patient was back pedaling in a gym workout this evening and injured her left ankle. She twisted into an inverted position. Afterwards her ankle is painful and she couldn't walk on it. She had no prior ankle injury.). The problem occurs constantly. The problem has not changed since onset.Associated symptoms comments: None . The symptoms are aggravated by walking. The symptoms are relieved by ice. She has tried a cold compress for the symptoms. The treatment provided mild relief.    Past Medical History  Diagnosis Date  . MRSA (methicillin resistant Staphylococcus aureus)     recurrent skin sores   . Thumb fracture     left  . Chicken pox   . 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 Diabetes mellitus   . Obesity     Past Surgical History  Procedure Date  . Spinal fusion 2010    Family History  Problem Relation Age of Onset  . Ulcerative colitis Father   . Rashes / Skin problems Father     psoriasis  . Sarcoidosis Father     skin  . Asthma Other   . Allergies Other   . Rashes / Skin problems Other     aczema  . Diabetes Other     History  Substance Use Topics  . Smoking status: Former Games developer  . Smokeless tobacco: Not on file  . Alcohol Use: No    OB History    Grav Para Term Preterm Abortions TAB SAB Ect Mult Living                  Review of Systems  All other systems reviewed and are  negative.    Allergies  Sulfamethoxazole w/trimethoprim  Home Medications   Current Outpatient Rx  Name Route Sig Dispense Refill  . IBUPROFEN 200 MG PO TABS Oral Take 200 mg by mouth every 6 (six) hours as needed.    . MELOXICAM 7.5 MG PO TABS Oral Take 1 tablet (7.5 mg total) by mouth daily. 14 tablet 0  . METFORMIN HCL ER 500 MG PO TB24 Oral Take 1 tablet (500 mg total) by mouth daily with breakfast. 30 tablet 5  . IBUPROFEN 400 MG PO TABS  Take Ibuprofen 400 mg four times per day with food or milk as needed for pain. 20 tablet 0    BP 131/76  Pulse 96  Temp(Src) 98.3 F (36.8 C) (Oral)  Resp 18  Ht 5\' 6"  (1.676 m)  Wt 248 lb (112.492 kg)  BMI 40.03 kg/m2  SpO2 99%  LMP 06/18/2011  Physical Exam  Nursing note and vitals reviewed. Constitutional: She is oriented to person, place, and time.       Obese young woman in mild distress with left ankle pain.  HENT:  Head: Normocephalic and atraumatic.  Musculoskeletal:       She localizes pain to the lateral malleolus for left ankle. She has tenderness over  that and over the left deltoid ligament. There is no palpable bony deformity. She has intact pulses sensation and tendon function in the left foot.  Neurological: She is alert and oriented to person, place, and time.       No sensory or motor deficit.  Skin: Skin is warm and dry.  Psychiatric: She has a normal mood and affect. Her behavior is normal.    ED Course  Procedures (including critical care time)  Labs Reviewed - No data to display Dg Ankle Complete Left  08/30/2011  *RADIOLOGY REPORT*  Clinical Data: Left ankle pain after fall.  LEFT ANKLE COMPLETE - 3+ VIEW  Comparison: Left foot 04/24/2004  Findings: The left ankle appears inverted, likely to patient positioning.  The ankle mortis and talar dome appear intact.  Large accessory ossicle posterior to the talus, stable since previous study.  No evidence of acute fracture or subluxation.  No focal bone lesion or  bone destruction.  Bone cortex and trabecular architecture appear intact.  No abnormal periosteal reaction.  IMPRESSION: No acute bony abnormalities demonstrated.  Inverted position in the left ankle may be due to patient positioning.  Ligamentous injury is not excluded.  Original Report Authenticated By: Marlon Pel, M.D.   DISP: Patient is to wear an ASO and walk with crutches when up. When at rest she should elevate her left ankle and applied to reduce swelling. She can take ibuprofen 400 mg every 6 hours when necessary pain, and should take that medication with food or milk. She should follow up with Dorene Grebe, M.D., orthopedist on call, if she doesn't see improvement in the next 3-5 days.  1. Sprain of left ankle             Carleene Cooper III, MD 08/31/11 [phone]

## 2011-09-06 ENCOUNTER — Other Ambulatory Visit: Payer: Self-pay

## 2011-09-06 MED ORDER — METFORMIN HCL ER 500 MG PO TB24: 500.0000 mg | ORAL_TABLET | Freq: Every day | ORAL | Status: DC

## 2011-09-21 ENCOUNTER — Encounter: Payer: Self-pay | Admitting: *Deleted

## 2011-09-21 ENCOUNTER — Encounter: Payer: BC Managed Care – PPO | Attending: Internal Medicine | Admitting: *Deleted

## 2011-09-21 VITALS — Ht 66.5 in | Wt 230.0 lb

## 2011-09-21 DIAGNOSIS — E669 Obesity, unspecified: Secondary | ICD-10-CM | POA: Insufficient documentation

## 2011-09-21 DIAGNOSIS — Z713 Dietary counseling and surveillance: Secondary | ICD-10-CM | POA: Insufficient documentation

## 2011-09-21 NOTE — Progress Notes (Signed)
  Medical Nutrition Therapy:  Appt start time: 1100 end time:  1200.   Assessment:  Primary concerns today: obesity, insulin resistance/prediabetes.   MEDICATIONS: see list      DIETARY INTAKE:  Usual eating pattern includes 1-3 meals and 1 snacks per day.  Everyday foods include refined carbohydrates and sugary drinks; meats and some vegetables.  Avoided foods include sodas.    24-hr recall:  B ( AM): sometimes eats (2-3 days) sweet cereal very large bowl with 2% milk; coffee with cream and sugar; sometimes has strawberry yogurt  Snk ( AM): sometimes eats nutrigrain bar or pretzels; Capri Sun L ( PM): chicken breast pan-fried with 1 cup rice; white bread sandwich lean meat with swiss cheese with mayo or mustard; water capri sun Snk ( PM): none D ( PM): salads with grilled meats; occasionally get fried foods.  Eats out often at buffet style restaurant; sweet tea.  Snk ( PM): none Beverages: water, capri sun, coffee, sweet tea  Usual physical activity: just started working out with personal trainer 3 days a week. Walks on treadmill 30 min 2 days.  Estimated energy needs: 1500 calories 170 g carbohydrates 112 g protein 42 g fat  Progress Towards Goal(s):  In progress.   Nutritional Diagnosis:  NI-5.8.4 Inconsistent carbohydrate intake As related to meal skipping and refined carbohydrate consumption.  As evidenced by obesity and insulin resistance.    Intervention:  Nutrition counseling provided.  Focused mainly on carbohydrate counting.  Discussed basics of diabetes and the role of excess weight on insulin resistance.  Discussed food labels and how to make healthy food choices by reading labels.  Encouraged 3 meals and 1-2 snacks every day and not to skip meals.  Discussed excessive dietary fat some, in relation to heart complications of uncontrolled diabetes and the role of excessive dietary fat on heart disease.    Handouts given during visit include: Carb Counting and Food  Label handouts Meal Plan Card  Monitoring/Evaluation:  Dietary intake, exercise, carb counting, and body weight in 5 week(s).  Will evaluate/review carb counting, discuss physical activity, and address anemia.

## 2011-09-21 NOTE — Patient Instructions (Signed)
Eat 3 meals every day and 1-2 snacks Meals should have 2-3 carb choices and snacks 1 carb choice Use food label to identify healthy food and beverage choices Aim to eat 5-10 g fat at each meal.

## 2011-10-09 ENCOUNTER — Ambulatory Visit: Payer: Managed Care, Other (non HMO) | Admitting: Internal Medicine

## 2011-10-09 DIAGNOSIS — Z0289 Encounter for other administrative examinations: Secondary | ICD-10-CM

## 2011-10-19 ENCOUNTER — Encounter: Payer: Self-pay | Admitting: *Deleted

## 2011-10-19 ENCOUNTER — Encounter: Payer: BC Managed Care – PPO | Attending: Internal Medicine | Admitting: *Deleted

## 2011-10-19 VITALS — Ht 66.0 in | Wt 235.0 lb

## 2011-10-19 DIAGNOSIS — E669 Obesity, unspecified: Secondary | ICD-10-CM | POA: Insufficient documentation

## 2011-10-19 DIAGNOSIS — Z713 Dietary counseling and surveillance: Secondary | ICD-10-CM | POA: Insufficient documentation

## 2011-10-19 DIAGNOSIS — Z68.41 Body mass index (BMI) pediatric, greater than or equal to 95th percentile for age: Secondary | ICD-10-CM

## 2011-10-19 NOTE — Patient Instructions (Addendum)
Goals:  Eat 3 meals/day, Avoid meal skipping: granola bar or piece of fruit or yogurt or honey nut cheerios.  Limit sugary cereals   Choose lower-fat options like pretzels, baked chips, frozen yogurt, sherbet, Laughing Cow cream cheese  Aim for >30 min of physical activity 4 days a week  Boost iron by increasing whole grains, dried beans, leafy greens, and lean meats  Limit sugar-sweetened beverages and concentrated sweets  Try diet ocean spray juice drink

## 2011-10-19 NOTE — Progress Notes (Signed)
Follow-up Medical Nutrition Therapy:  Appt start time: 1730 end time:  1800.  Wt Readings from Last 3 Encounters:  10/19/11 235 lb (106.595 kg) (99.11%*)  09/21/11 230 lb (104.327 kg) (98.98%*)  08/30/11 248 lb (112.492 kg) (99.35%*)   Assessment:  Primary concerns today: obesity.   MEDICATIONS: see list   DIETARY INTAKE:  Usual eating pattern includes 2 meals and 3 snacks per day.  Everyday foods include energy-dense foods.  Avoided foods include sodas.    24-hr recall:  B ( AM): skips 5 days a week.  May have cinnamon toast crunch cereal with lactaid milk; or banana Snk ( AM): chips or crackers  L ( PM): meat and cheese sandwich with tomato with juice Snk ( PM): crackers or chips  D ( PM): meat, vegetables, juice Snk ( PM): ice cream Beverages: ocean spray juice.  Not much artificially sweetened beverages  Usual physical activity: walking 1-2 days a week times 30 minutes  Estimated energy needs: 1200 calories 135 g carbohydrates 90 g protein 33 g fat  Progress Towards Goal(s):  Some progress.   Nutritional Diagnosis:  NI-5.8.4 Inconsistent carbohydrate intake as related to meal skipping and refined carbohydrates.  As evidenced by obeisty adn insulin resistance.    Intervention:  Nutrition counseling provided.  Patient gained 5 pounds since last visit, but was on vacation and was not following healthy meal plan.  She has reduced artificially sweetened beverages like sodas, tea, and capri sun however she drinks a lot of juice now.  She still skips meals, she still chooses refined carbohydrates, she still eats high fats foods, and she still has limited physical activity.  Discussed small changes she can make to reduce calories like choosing low-er fat and lower sugar options for the foods she likes.  Encouraged more physical activity and to avoid meal skipping  Handouts given during visit include:  Healthy snacks for adults  Goals:  Eat 3 meals/day, Avoid meal skipping:  granola bar or piece of fruit or yogurt or honey nut cheerios.  Limit sugary cereals   Choose lower-fat options like pretzels, baked chips, frozen yogurt, sherbet, Laughing Cow cream cheese  Aim for >30 min of physical activity 4 days a week  Boost iron by increasing whole grains, dried beans, leafy greens, and lean meats  Limit sugar-sweetened beverages and concentrated sweets  Try diet ocean spray juice drink  Monitoring/Evaluation:  Dietary intake, exercise, and body weight in 4 week(s).

## 2011-11-20 ENCOUNTER — Encounter: Payer: Self-pay | Admitting: *Deleted

## 2011-11-20 ENCOUNTER — Encounter: Payer: BC Managed Care – PPO | Attending: Internal Medicine | Admitting: *Deleted

## 2011-11-20 VITALS — Wt 232.2 lb

## 2011-11-20 DIAGNOSIS — Z713 Dietary counseling and surveillance: Secondary | ICD-10-CM | POA: Insufficient documentation

## 2011-11-20 DIAGNOSIS — E669 Obesity, unspecified: Secondary | ICD-10-CM | POA: Insufficient documentation

## 2011-11-20 NOTE — Progress Notes (Signed)
  Medical Nutrition Therapy:  Appt start time: 1630 end time:  1700.  Assessment:  Primary concerns today: obesity.   MEDICATIONS: see list   DIETARY INTAKE:  Usual eating pattern includes 3 meals and 2 snacks per day.  Everyday foods include fruits, starches, meats, some vegetables.  Avoided foods include soda  24-hr recall:  B ( AM): Honey nut cheerios  Snk ( AM): none  L ( PM): grilled cheese sandwich or other sandwich with banana and 2% lactaid Snk ( PM): banana or other fruits D ( PM): meat with vegetables  Snack:ice cream sandwich Beverages: water with crystal light Usual physical activity: walks 20 min 2 day a week  Estimated energy needs: 1400 calories 158 g carbohydrates 105 g protein 39 g fat  Progress Towards Goal(s):  Some progress.   Nutritional Diagnosis:  [place]-3.3 Overweight/obesity As related to history of large portions of energy-dense foods adn physical inactivity.  As evidenced by BMI of 37.    Intervention:  Nutrition counseling provided.  Kaylee Holmes has lost 3 lb since last visit!  She has made several dietary changes and she was praised today for those accomplishments.  She continues to limit soda and has also limited juice drinks.  She now drinks crystal light or diet juices.  She also eats breakfast every day and has switched cereals from cinnamon toast crunch to honey nut cheerios.  She is eating more fruits as snacks and chooses reduced-fat chips instead of regular.  Her physical activity is not yet at the recommended level and encouraged her today to walk more days out of the week.  Encouraged more variety with her fruits and vegetables and to choose whole wheat crackers or pretzels instead of Ritz crackers.  Monitoring/Evaluation:  Dietary intake, exercise, and body weight in 6 week(s).

## 2011-11-20 NOTE — Patient Instructions (Addendum)
Continue eating 3 meals/day Continue healthy snacks- aim for more variety in fruits and vegetables Snack on reduced fat chips, fruits, veggies, whole grain crackers like Triscuit or Wheat Thins or pretzels  Aim for walking 4 days/week at brisk pace

## 2012-01-09 ENCOUNTER — Encounter: Payer: Self-pay | Admitting: *Deleted

## 2012-01-09 ENCOUNTER — Encounter: Payer: BC Managed Care – PPO | Attending: Internal Medicine | Admitting: *Deleted

## 2012-01-09 VITALS — Ht 66.0 in | Wt 232.6 lb

## 2012-01-09 DIAGNOSIS — R7309 Other abnormal glucose: Secondary | ICD-10-CM

## 2012-01-09 DIAGNOSIS — E8881 Metabolic syndrome: Secondary | ICD-10-CM

## 2012-01-09 DIAGNOSIS — Z713 Dietary counseling and surveillance: Secondary | ICD-10-CM | POA: Insufficient documentation

## 2012-01-09 DIAGNOSIS — Z68.41 Body mass index (BMI) pediatric, greater than or equal to 95th percentile for age: Secondary | ICD-10-CM

## 2012-01-09 DIAGNOSIS — E669 Obesity, unspecified: Secondary | ICD-10-CM

## 2012-01-09 NOTE — Patient Instructions (Signed)
Continue previous  Goals:  Eat 3 meals/day, Avoid meal skipping   Increase protein rich foods  Follow "Plate Method" for portion control  Limit carbohydrate1-2 servings/meal   Choose more whole grains, lean protein, low-fat dairy, and fruits/non-starchy vegetables.   Aim for >30 min of physical activity daily  Limit sugar-sweetened beverages and concentrated sweets

## 2012-01-09 NOTE — Progress Notes (Signed)
  Medical Nutrition Therapy:  Appt start time: 1500 end time:  1530.   Assessment:  Primary concerns today: obesity and prediabetes follow up.   MEDICATIONS: none   DIETARY INTAKE:  Usual eating pattern includes 3 meals and 1 snacks per day.  Everyday foods include starches, proteins, some fruits and vegetables.  Avoided foods include none.    24-hr recall:  B ( AM): honey bunches of oats or honey nut cheerios with 2% lactaid  Snk ( AM): watermelon  L ( PM): deli sandwich at [employer] or lunchable with water Snk ( PM): none D ( PM): chicken, rice, vegetable Snk ( PM): none Beverages: water   Usual physical activity: elliptical 30 min 2-3 days  Estimated energy needs: 1600 calories 180 g carbohydrates 120 g protein 44 g fat  Progress Towards Goal(s):  No progress.   Nutritional Diagnosis:  [place]-3.3 Overweight/obesity As related to history of large portions of energy-dense foods and physical inactivity. As evidenced by BMI of 37.  Intervention:  Nutrition counseling provided.  Kaylee Holmes is here for another follow up related to obesity and prediabetes.  She has made no changes since last visit.  She still chooses less-sugary cereal with 2% milk, dinners are still balanced, but her lunches are now processed/packaged.  She just started working at [employer] and buys a packaged meal from [employer].  She also is not physically active except 2 days a week.  Discussed results from the TODAY study: youth with DM2 are at increased risk for diabetes-related co-morbidities, compared to adults with DM2.  These co-morbidities occur faster in youth, compared to adults.  Kaylee Holmes doesn't have DM2 yet, but she's prediabetic and hasn't made much progress towards loosing weight and decreasing her risk.  STRONGLY emphasized need for daily physical activity.  Family just bought an elliptical machine- encouraged Kaylee Holmes to use it daily.  And to pack healthy lunch for work so she doesn't have to buy from the deli.       Monitoring/Evaluation:  Dietary intake, exercise, and body weight in 1 month(s).

## 2012-02-09 ENCOUNTER — Encounter: Payer: BC Managed Care – PPO | Attending: Internal Medicine | Admitting: *Deleted

## 2012-02-09 VITALS — Ht 66.0 in | Wt 228.6 lb

## 2012-02-09 DIAGNOSIS — Z713 Dietary counseling and surveillance: Secondary | ICD-10-CM | POA: Insufficient documentation

## 2012-02-09 DIAGNOSIS — R7309 Other abnormal glucose: Secondary | ICD-10-CM

## 2012-02-09 DIAGNOSIS — Z68.41 Body mass index (BMI) pediatric, greater than or equal to 95th percentile for age: Secondary | ICD-10-CM

## 2012-02-09 DIAGNOSIS — E8881 Metabolic syndrome: Secondary | ICD-10-CM

## 2012-02-09 DIAGNOSIS — E669 Obesity, unspecified: Secondary | ICD-10-CM | POA: Insufficient documentation

## 2012-02-09 NOTE — Progress Notes (Signed)
Pediatric Medical Nutrition Therapy:  Appt start time: 0930 end time:  1000.  Primary Concerns Today:  Obesity and prediabetes follow up  Wt Readings from Last 3 Encounters:  02/09/12 228 lb 9.6 oz (103.692 kg)  01/09/12 232 lb 9.6 oz (105.507 kg) (99.06%*)  11/20/11 232 lb 3.2 oz (105.325 kg) (99.05%*)  Body mass index is 36.90 kg/(m^2).   24-hr dietary recall: B (AM):  Cheerios or bagel thins with 1/3 less fat cream cheese.  lactaid 2% milk Snk (AM):  Fruit cup L (PM):  Sandwich from home with water Snk (PM):  none D (PM):  Grilled meat and vegetables with starch Snk (HS):  posicle or ice cream  Usual physical activity: treadmill 3 days at least times 30 minutes.  Walk a lot at work.  Estimated energy needs: 1600 calories 180 g carbohydrates 120 g protein 44 g fat  Nutritional Diagnosis:  [place]-3.3 Overweight/obesity As related to history of large portions of energy-dense foods adn physical inactivity. As evidenced by BMI of 37.0  Progress towards goals: Some progress  Intervention/Goals: Nutrition counseling provided.  Kaylee Holmes is here for a follow up related to obesity and prediabetes.  She has been coming to our center for many months now and has previously not made any progress towards her goals.  However, she has lost 4 pounds since last visit.  She has increased her activity both at home and at work, chooses healthier foods for meals and snacks, and drinks more water.  She has cut out McDonald's, increased fruits, and has whole grain cereal for breakfast.  Praised her for her good work and progress and encouraged her to keep up the good work.  Suggested again 1% milk at home and regular physical activity.  Discussed upcoming holidays and celebrations.  Advised her to choose the foods that are her favorites and skip the rest.  Don't fill up on breads and stuffing, but choose more proteins and vegetables, and save room for the desserts.  Also suggested stepping up activity during  the holidays to compensate for extra calories.  Monitoring/Evaluation:  Dietary intake, exercise, and body weight in 3 month(s).

## 2012-02-20 ENCOUNTER — Emergency Department (INDEPENDENT_AMBULATORY_CARE_PROVIDER_SITE_OTHER): Payer: BC Managed Care – PPO

## 2012-02-20 ENCOUNTER — Encounter (HOSPITAL_COMMUNITY): Payer: Self-pay | Admitting: Emergency Medicine

## 2012-02-20 ENCOUNTER — Emergency Department (HOSPITAL_COMMUNITY)
Admission: EM | Admit: 2012-02-20 | Discharge: 2012-02-20 | Disposition: A | Discharge status: Home / Self Care | Payer: BC Managed Care – PPO | Source: Home / Self Care | Attending: Family Medicine | Admitting: Family Medicine

## 2012-02-20 DIAGNOSIS — R0789 Other chest pain: Secondary | ICD-10-CM

## 2012-02-20 MED ORDER — DICLOFENAC POTASSIUM 50 MG PO TABS: 50.0000 mg | ORAL_TABLET | Freq: Three times a day (TID) | ORAL | Status: DC

## 2012-02-20 NOTE — ED Notes (Signed)
Pt c/o chest pain x1 day... Woke up yesterday around 09:00 w/pain on right side of chest... Sx include: SOB, pain when she inhales... Denies: fevers, vomiting, nausea, diarrhea, headaches, dizziness... Pt is alert w/no signs of distress and is sitting up straight.

## 2012-02-20 NOTE — ED Provider Notes (Signed)
History     CSN: [id number]  Arrival date & time 02/20/12  1009   First MD Initiated Contact with Patient 02/20/12 1013      Chief Complaint  Patient presents with  . Chest Pain    (Consider location/radiation/quality/duration/timing/severity/associated sxs/prior treatment) Patient is a 21 y.o. female presenting with cough. The history is provided by the patient.  Cough This is a new problem. The current episode started yesterday. The problem has been gradually worsening. The cough is non-productive. There has been no fever. Associated symptoms include chest pain. Pertinent negatives include no shortness of breath and no wheezing. She is not a smoker.    Past Medical History  Diagnosis Date  . MRSA (methicillin resistant Staphylococcus aureus)     recurrent skin sores   . Thumb fracture     left  . Chicken pox   . Diabetes mellitus   . Obesity     Past Surgical History  Procedure Date  . Spinal fusion 2010    Family History  Problem Relation Age of Onset  . Ulcerative colitis Father   . Rashes / Skin problems Father     psoriasis  . Sarcoidosis Father     skin  . Asthma Other   . Allergies Other   . Rashes / Skin problems Other     aczema  . Diabetes Other     History  Substance Use Topics  . Smoking status: Former Games developer  . Smokeless tobacco: Not on file  . Alcohol Use: No    OB History    Grav Para Term Preterm Abortions TAB SAB Ect Mult Living                  Review of Systems  Constitutional: Negative.   HENT: Negative.   Respiratory: Positive for cough. Negative for chest tightness, shortness of breath, wheezing and stridor.   Cardiovascular: Positive for chest pain. Negative for palpitations and leg swelling.  Gastrointestinal: Negative.     Allergies  Sulfamethoxazole w-trimethoprim  Home Medications   Current Outpatient Rx  Name Route Sig Dispense Refill  . DICLOFENAC POTASSIUM 50 MG PO TABS Oral Take 1 tablet (50 mg total) by  mouth 3 (three) times daily. As needed for soreness. 30 tablet 0  . IBUPROFEN 400 MG PO TABS  Take Ibuprofen 400 mg four times per day with food or milk as needed for pain. 20 tablet 0  . IBUPROFEN 200 MG PO TABS Oral Take 200 mg by mouth every 6 (six) hours as needed.    . MELOXICAM 7.5 MG PO TABS Oral Take 1 tablet (7.5 mg total) by mouth daily. 14 tablet 0  . METFORMIN HCL ER 500 MG PO TB24 Oral Take 1 tablet (500 mg total) by mouth daily with breakfast. 90 tablet 1    BP 122/87  Pulse 76  Temp 98.8 F (37.1 C) (Oral)  Resp 18  SpO2 99%  LMP 01/11/2012  Physical Exam  Nursing note and vitals reviewed. Constitutional: She is oriented to person, place, and time. She appears well-developed and well-nourished.  HENT:  Head: Normocephalic.  Right Ear: External ear normal.  Left Ear: External ear normal.  Mouth/Throat: Oropharynx is clear and moist.  Eyes: Pupils are equal, round, and reactive to light.  Neck: Normal range of motion. Neck supple.  Cardiovascular: Normal rate, regular rhythm, normal heart sounds and intact distal pulses.   Pulmonary/Chest: Effort normal and breath sounds normal. She exhibits tenderness.  Right 6-7 rib area soreness ant and post to right chest reproducing sx to palpation.  Abdominal: Soft. Bowel sounds are normal. There is no tenderness.  Lymphadenopathy:    She has no cervical adenopathy.  Neurological: She is alert and oriented to person, place, and time.  Skin: Skin is warm and dry.    ED Course  Procedures (including critical care time)  Labs Reviewed - No data to display Dg Chest 2 View  02/20/2012  *RADIOLOGY REPORT*  Clinical Data: Chest pain and shortness of breath  CHEST - 2 VIEW  Comparison: None.  Findings: Lungs clear.  Heart size and pulmonary vascularity are normal.  No adenopathy.  No bone lesions.    IMPRESSION:  Lungs clear.   Original Report Authenticated By: Arvin Collard. WOODRUFF III, M.D.      1. Musculoskeletal chest  pain       MDM  X-rays reviewed and report per radiologist.           Linna Hoff, MD 02/20/12 1220

## 2012-05-10 ENCOUNTER — Ambulatory Visit: Payer: BC Managed Care – PPO | Admitting: *Deleted

## 2012-08-23 ENCOUNTER — Encounter (HOSPITAL_BASED_OUTPATIENT_CLINIC_OR_DEPARTMENT_OTHER): Payer: Self-pay | Admitting: *Deleted

## 2012-08-23 DIAGNOSIS — Z8619 Personal history of other infectious and parasitic diseases: Secondary | ICD-10-CM | POA: Insufficient documentation

## 2012-08-23 DIAGNOSIS — E669 Obesity, unspecified: Secondary | ICD-10-CM | POA: Insufficient documentation

## 2012-08-23 DIAGNOSIS — E119 Type 2 diabetes mellitus without complications: Secondary | ICD-10-CM | POA: Insufficient documentation

## 2012-08-23 DIAGNOSIS — J069 Acute upper respiratory infection, unspecified: Secondary | ICD-10-CM | POA: Insufficient documentation

## 2012-08-23 DIAGNOSIS — Z86718 Personal history of other venous thrombosis and embolism: Secondary | ICD-10-CM | POA: Insufficient documentation

## 2012-08-23 DIAGNOSIS — Z8614 Personal history of Methicillin resistant Staphylococcus aureus infection: Secondary | ICD-10-CM | POA: Insufficient documentation

## 2012-08-23 DIAGNOSIS — J3489 Other specified disorders of nose and nasal sinuses: Secondary | ICD-10-CM | POA: Insufficient documentation

## 2012-08-23 DIAGNOSIS — Z87891 Personal history of nicotine dependence: Secondary | ICD-10-CM | POA: Insufficient documentation

## 2012-08-23 DIAGNOSIS — R059 Cough, unspecified: Secondary | ICD-10-CM | POA: Insufficient documentation

## 2012-08-23 DIAGNOSIS — R05 Cough: Secondary | ICD-10-CM | POA: Insufficient documentation

## 2012-08-23 NOTE — ED Notes (Signed)
Reports SOB x 1 day- painful to breathe- reports fever earlier

## 2012-08-24 ENCOUNTER — Emergency Department (HOSPITAL_BASED_OUTPATIENT_CLINIC_OR_DEPARTMENT_OTHER): Payer: BC Managed Care – PPO

## 2012-08-24 ENCOUNTER — Emergency Department (HOSPITAL_BASED_OUTPATIENT_CLINIC_OR_DEPARTMENT_OTHER)
Admission: EM | Admit: 2012-08-24 | Discharge: 2012-08-24 | Disposition: A | Discharge status: Home / Self Care | Payer: BC Managed Care – PPO | Attending: Emergency Medicine | Admitting: Emergency Medicine

## 2012-08-24 DIAGNOSIS — B9789 Other viral agents as the cause of diseases classified elsewhere: Secondary | ICD-10-CM

## 2012-08-24 MED ORDER — ALBUTEROL SULFATE HFA 108 (90 BASE) MCG/ACT IN AERS
2.0000 | INHALATION_SPRAY | RESPIRATORY_TRACT | Status: DC | PRN
  Administered 2012-08-24: 2 via RESPIRATORY_TRACT
  Filled 2012-08-24: qty 6.7

## 2012-08-24 MED ORDER — HYDROCOD POLST-CHLORPHEN POLST 10-8 MG/5ML PO LQCR: 5.0000 mL | Freq: Two times a day (BID) | ORAL | Status: DC | PRN

## 2012-08-24 NOTE — ED Provider Notes (Signed)
History     CSN: [id number]  Arrival date & time 08/23/12  2311   First MD Initiated Contact with Patient 08/24/12 0155      Chief Complaint  Patient presents with  . Shortness of Breath    (Consider location/radiation/quality/duration/timing/severity/associated sxs/prior treatment) HPI 22 year old female woke up yesterday morning with her head throbbing, nasal congestion, cough, difficulty taking a deep breath and chest wall soreness. She is taking her own antihistamine without relief. Her symptoms are mild-to-moderate. She has not been vomiting or diarrhea. She has had a fever.  Past Medical History  Diagnosis Date  . MRSA (methicillin resistant Staphylococcus aureus)     recurrent skin sores   . Thumb fracture     left  . Chicken pox   . Obesity   . Diabetes mellitus     "pre diabetic"    Past Surgical History  Procedure Laterality Date  . Spinal fusion  2010    Family History  Problem Relation Age of Onset  . Ulcerative colitis Father   . Rashes / Skin problems Father     psoriasis  . Sarcoidosis Father     skin  . Asthma Other   . Allergies Other   . Rashes / Skin problems Other     aczema  . Diabetes Other     History  Substance Use Topics  . Smoking status: Former Games developer  . Smokeless tobacco: Never Used  . Alcohol Use: No    OB History   Grav Para Term Preterm Abortions TAB SAB Ect Mult Living                  Review of Systems  All other systems reviewed and are negative.    Allergies  Sulfamethoxazole w-trimethoprim  Home Medications   Current Outpatient Rx  Name  Route  Sig  Dispense  Refill  . diclofenac (CATAFLAM) 50 MG tablet   Oral   Take 1 tablet (50 mg total) by mouth 3 (three) times daily. As needed for soreness.   30 tablet   0   . ibuprofen (ADVIL,MOTRIN) 400 MG tablet      Take Ibuprofen 400 mg four times per day with food or milk as needed for pain.   20 tablet   0   . ibuprofen (MOTRIN IB) 200 MG tablet  Oral   Take 200 mg by mouth every 6 (six) hours as needed.         . meloxicam (MOBIC) 7.5 MG tablet   Oral   Take 1 tablet (7.5 mg total) by mouth daily.   14 tablet   0   . metFORMIN (GLUCOPHAGE-XR) 500 MG 24 hr tablet   Oral   Take 1 tablet (500 mg total) by mouth daily with breakfast.   90 tablet   1     BP 103/59  Pulse 109  Temp(Src) 99.6 F (37.6 C) (Oral)  Resp 16  Ht 5\' 5"  (1.651 m)  Wt 235 lb (106.595 kg)  BMI 39.11 kg/m2  SpO2 97%  LMP 08/06/2012  Physical Exam General: Well-developed, well-nourished female in no acute distress; appearance consistent with age of record HENT: normocephalic, atraumatic; no pharyngeal erythema or exudate; nasal congestion Eyes: pupils equal round and reactive to light; extraocular muscles intact Neck: supple Heart: regular rate and rhythm Lungs: Mildly decreased air movement bilaterally without wheezing; patient not taking full breaths due to chest wall discomfort Abdomen: soft; nondistended; nontender Extremities: No deformity; full range of motion Neurologic:  Awake, alert and oriented; motor function intact in all extremities and symmetric; no facial droop Skin: Warm and dry Psychiatric: Normal mood and affect    ED Course  Procedures (including critical care time)    MDM  Nursing notes and vitals signs, including pulse oximetry, reviewed.  Summary of this visit's results, reviewed by myself:  Labs:  No results found for this or any previous visit (from the past 24 hour(s)).  Imaging Studies: Dg Chest 2 View  08/24/2012  *RADIOLOGY REPORT*  Clinical Data: Mid chest pain, hurts when she breathes in  CHEST - 2 VIEW  Comparison: 02/20/2012  Findings: Normal heart size, mediastinal contours, and pulmonary vascularity. Lungs clear. Bones unremarkable. No pneumothorax.  IMPRESSION: Normal exam.   Original Report Authenticated By: Ulyses Southward, M.D.             Hanley Seamen, MD 08/24/12 [phone]

## 2012-08-24 NOTE — Patient Instructions (Signed)
Instructed patient on the proper use of administering albuteral mdi via aerochamber patient tolerated well 

## 2013-09-10 LAB — HM DIABETES EYE EXAM

## 2013-09-15 ENCOUNTER — Encounter (HOSPITAL_COMMUNITY): Payer: Self-pay | Admitting: Emergency Medicine

## 2013-09-15 ENCOUNTER — Emergency Department (HOSPITAL_COMMUNITY)
Admission: EM | Admit: 2013-09-15 | Discharge: 2013-09-15 | Disposition: A | Discharge status: Home / Self Care | Payer: BC Managed Care – PPO | Source: Home / Self Care | Attending: Family Medicine | Admitting: Family Medicine

## 2013-09-15 DIAGNOSIS — M545 Low back pain, unspecified: Secondary | ICD-10-CM

## 2013-09-15 MED ORDER — DICLOFENAC POTASSIUM 50 MG PO TABS: 50.0000 mg | ORAL_TABLET | Freq: Three times a day (TID) | ORAL | Status: DC

## 2013-09-15 MED ORDER — CYCLOBENZAPRINE HCL 5 MG PO TABS: 5.0000 mg | ORAL_TABLET | Freq: Three times a day (TID) | ORAL | Status: DC | PRN

## 2013-09-15 NOTE — ED Provider Notes (Signed)
CSN: [id number]     Arrival date & time 09/15/13  1542 History   First MD Initiated Contact with Patient 09/15/13 1706     Chief Complaint  Patient presents with  . Back Pain   (Consider location/radiation/quality/duration/timing/severity/associated sxs/prior Treatment) Patient is a 23 y.o. female presenting with back pain. The history is provided by the patient.  Back Pain Location:  Lumbar spine Quality:  Aching Radiates to:  Does not radiate Pain severity:  Mild Onset quality:  Gradual Progression:  Worsening Chronicity:  Chronic Context comment:  [facility], s/p lumbar fusion dr Newell Coralnudelman, 2012. Relieved by:  None tried Ineffective treatments:  NSAIDs Associated symptoms: no abdominal pain, no bladder incontinence, no bowel incontinence, no chest pain, no dysuria, no fever, no leg pain, no numbness, no paresthesias and no weakness   Risk factors: obesity   Risk factors comment:  Lumbar fusion   Past Medical History  Diagnosis Date  . MRSA (methicillin resistant Staphylococcus aureus)     recurrent skin sores   . Thumb fracture     left  . Chicken pox   . Obesity   . Diabetes mellitus     "pre diabetic"   Past Surgical History  Procedure Laterality Date  . Spinal fusion  2010   Family History  Problem Relation Age of Onset  . Ulcerative colitis Father   . Rashes / Skin problems Father     psoriasis  . Sarcoidosis Father     skin  . Asthma Other   . Allergies Other   . Rashes / Skin problems Other     aczema  . Diabetes Other    History  Substance Use Topics  . Smoking status: Former Games developermoker  . Smokeless tobacco: Never Used  . Alcohol Use: Yes   OB History   Grav Para Term Preterm Abortions TAB SAB Ect Mult Living                 Review of Systems  Constitutional: Negative.  Negative for fever.  Cardiovascular: Negative for chest pain.  Gastrointestinal: Negative.  Negative for abdominal pain and bowel incontinence.  Genitourinary: Negative.  Negative for  bladder incontinence and dysuria.  Musculoskeletal: Positive for back pain. Negative for gait problem, joint swelling and myalgias.  Neurological: Negative for weakness, numbness and paresthesias.    Allergies  Sulfamethoxazole-trimethoprim  Home Medications   Prior to Admission medications   Medication Sig Start Date End Date Taking? Authorizing Provider  chlorpheniramine-HYDROcodone (TUSSIONEX PENNKINETIC ER) 10-8 MG/5ML LQCR Take 5 mLs by mouth every 12 (twelve) hours as needed (for cough or chest wall pain). 08/24/12   Carlisle BeersJohn L Molpus, MD  diclofenac (CATAFLAM) 50 MG tablet Take 1 tablet (50 mg total) by mouth 3 (three) times daily. As needed for soreness. 02/20/12   Linna HoffJames D Desmon Hitchner, MD  ibuprofen (ADVIL,MOTRIN) 400 MG tablet Take Ibuprofen 400 mg four times per day with food or milk as needed for pain. 08/31/11   Carleene CooperAlan Davidson III, MD  ibuprofen (MOTRIN IB) 200 MG tablet Take 200 mg by mouth every 6 (six) hours as needed.    Historical Provider, MD  metFORMIN (GLUCOPHAGE-XR) 500 MG 24 hr tablet Take 1 tablet (500 mg total) by mouth daily with breakfast. 09/06/11 09/05/12  Madelin HeadingsWanda K Panosh, MD   LMP 09/03/2013 Physical Exam  Nursing note reviewed. Constitutional: She is oriented to person, place, and time. She appears well-developed and well-nourished.  Abdominal: Soft. Bowel sounds are normal.  Musculoskeletal: She exhibits tenderness.  Lumbar back: She exhibits decreased range of motion, tenderness, pain and spasm. She exhibits no bony tenderness, no swelling and normal pulse.       Back:  Neurological: She is alert and oriented to person, place, and time.  Skin: Skin is warm and dry.    ED Course  Procedures (including critical care time) Labs Review Labs Reviewed - No data to display  Imaging Review No results found.   MDM   1. Low back pain syndrome        Linna HoffJames D Daveah Varone, MD 09/15/13 1730

## 2013-09-15 NOTE — ED Notes (Signed)
C/o back pain, history of back pain.  This episode of back pain started yesterday, no particular injury.

## 2013-09-15 NOTE — ED Notes (Signed)
Vital signs not obtained when placed in treatment room

## 2013-10-01 ENCOUNTER — Encounter: Payer: Self-pay | Admitting: Internal Medicine

## 2014-07-07 ENCOUNTER — Emergency Department (HOSPITAL_COMMUNITY)
Admission: EM | Admit: 2014-07-07 | Discharge: 2014-07-07 | Disposition: A | Discharge status: Home / Self Care | Payer: BLUE CROSS/BLUE SHIELD | Source: Home / Self Care

## 2014-07-07 ENCOUNTER — Encounter (HOSPITAL_COMMUNITY): Payer: Self-pay | Admitting: Emergency Medicine

## 2014-07-07 DIAGNOSIS — S46911A Strain of unspecified muscle, fascia and tendon at shoulder and upper arm level, right arm, initial encounter: Secondary | ICD-10-CM

## 2014-07-07 DIAGNOSIS — M545 Low back pain, unspecified: Secondary | ICD-10-CM

## 2014-07-07 MED ORDER — DICLOFENAC SODIUM 75 MG PO TBEC: 75.0000 mg | DELAYED_RELEASE_TABLET | Freq: Two times a day (BID) | ORAL | Status: DC

## 2014-07-07 MED ORDER — METHOCARBAMOL 500 MG PO TABS: 500.0000 mg | ORAL_TABLET | Freq: Four times a day (QID) | ORAL | Status: DC | PRN
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
## 2014-07-07 NOTE — Discharge Instructions (Signed)
Fortunately there is no evidence of significant permanent injury Please start the voltaren and robaxin for the muscle strain in your back and shoulder region Stay active and consider doing some of the exercises recommended below      Back Exercises These exercises may help you when beginning to rehabilitate your injury. Your symptoms may resolve with or without further involvement from your physician, physical therapist or athletic trainer. While completing these exercises, remember:   Restoring tissue flexibility helps normal motion to return to the joints. This allows healthier, less painful movement and activity.  An effective stretch should be held for at least 30 seconds.  A stretch should never be painful. You should only feel a gentle lengthening or release in the stretched tissue. STRETCH - Extension, Prone on Elbows   Lie on your stomach on the floor, a bed will be too soft. Place your palms about shoulder width apart and at the height of your head.  Place your elbows under your shoulders. If this is too painful, stack pillows under your chest.  Allow your body to relax so that your hips drop lower and make contact more completely with the floor.  Hold this position for __________ seconds.  Slowly return to lying flat on the floor. Repeat __________ times. Complete this exercise __________ times per day.  RANGE OF MOTION - Extension, Prone Press Ups   Lie on your stomach on the floor, a bed will be too soft. Place your palms about shoulder width apart and at the height of your head.  Keeping your back as relaxed as possible, slowly straighten your elbows while keeping your hips on the floor. You may adjust the placement of your hands to maximize your comfort. As you gain motion, your hands will come more underneath your shoulders.  Hold this position __________ seconds.  Slowly return to lying flat on the floor. Repeat __________ times. Complete this exercise __________  times per day.  RANGE OF MOTION- Quadruped, Neutral Spine   Assume a hands and knees position on a firm surface. Keep your hands under your shoulders and your knees under your hips. You may place padding under your knees for comfort.  Drop your head and point your tail bone toward the ground below you. This will round out your low back like an angry cat. Hold this position for __________ seconds.  Slowly lift your head and release your tail bone so that your back sags into a large arch, like an old horse.  Hold this position for __________ seconds.  Repeat this until you feel limber in your low back.  Now, find your "sweet spot." This will be the most comfortable position somewhere between the two previous positions. This is your neutral spine. Once you have found this position, tense your stomach muscles to support your low back.  Hold this position for __________ seconds. Repeat __________ times. Complete this exercise __________ times per day.  STRETCH - Flexion, Single Knee to Chest   Lie on a firm bed or floor with both legs extended in front of you.  Keeping one leg in contact with the floor, bring your opposite knee to your chest. Hold your leg in place by either grabbing behind your thigh or at your knee.  Pull until you feel a gentle stretch in your low back. Hold __________ seconds.  Slowly release your grasp and repeat the exercise with the opposite side. Repeat __________ times. Complete this exercise __________ times per day.  STRETCH - Hamstrings,  Standing  Stand or sit and extend your right / left leg, placing your foot on a chair or foot stool  Keeping a slight arch in your low back and your hips straight forward.  Lead with your chest and lean forward at the waist until you feel a gentle stretch in the back of your right / left knee or thigh. (When done correctly, this exercise requires leaning only a small distance.)  Hold this position for __________  seconds. Repeat __________ times. Complete this stretch __________ times per day. STRENGTHENING - Deep Abdominals, Pelvic Tilt   Lie on a firm bed or floor. Keeping your legs in front of you, bend your knees so they are both pointed toward the ceiling and your feet are flat on the floor.  Tense your lower abdominal muscles to press your low back into the floor. This motion will rotate your pelvis so that your tail bone is scooping upwards rather than pointing at your feet or into the floor.  With a gentle tension and even breathing, hold this position for __________ seconds. Repeat __________ times. Complete this exercise __________ times per day.  STRENGTHENING - Abdominals, Crunches   Lie on a firm bed or floor. Keeping your legs in front of you, bend your knees so they are both pointed toward the ceiling and your feet are flat on the floor. Cross your arms over your chest.  Slightly tip your chin down without bending your neck.  Tense your abdominals and slowly lift your trunk high enough to just clear your shoulder blades. Lifting higher can put excessive stress on the low back and does not further strengthen your abdominal muscles.  Control your return to the starting position. Repeat __________ times. Complete this exercise __________ times per day.  STRENGTHENING - Quadruped, Opposite UE/LE Lift   Assume a hands and knees position on a firm surface. Keep your hands under your shoulders and your knees under your hips. You may place padding under your knees for comfort.  Find your neutral spine and gently tense your abdominal muscles so that you can maintain this position. Your shoulders and hips should form a rectangle that is parallel with the floor and is not twisted.  Keeping your trunk steady, lift your right hand no higher than your shoulder and then your left leg no higher than your hip. Make sure you are not holding your breath. Hold this position __________  seconds.  Continuing to keep your abdominal muscles tense and your back steady, slowly return to your starting position. Repeat with the opposite arm and leg. Repeat __________ times. Complete this exercise __________ times per day. Document Released: 05/12/2005 Document Revised: 07/17/2011 Document Reviewed: 08/06/2008 [facility] Patient Information 2015 [place], [place]. This information is not intended to replace advice given to you by your health care provider. Make sure you discuss any questions you have with your health care provider. Shoulder Exercises EXERCISES  RANGE OF MOTION (ROM) AND STRETCHING EXERCISES These exercises may help you when beginning to rehabilitate your injury. Your symptoms may resolve with or without further involvement from your physician, physical therapist or athletic trainer. While completing these exercises, remember:   Restoring tissue flexibility helps normal motion to return to the joints. This allows healthier, less painful movement and activity.  An effective stretch should be held for at least 30 seconds.  A stretch should never be painful. You should only feel a gentle lengthening or release in the stretched tissue. ROM - Pendulum  Bend at  the waist so that your right / left arm falls away from your body. Support yourself with your opposite hand on a solid surface, such as a table or a countertop.  Your right / left arm should be perpendicular to the ground. If it is not perpendicular, you need to lean over farther. Relax the muscles in your right / left arm and shoulder as much as possible.  Gently sway your hips and trunk so they move your right / left arm without any use of your right / left shoulder muscles.  Progress your movements so that your right / left arm moves side to side, then forward and backward, and finally, both clockwise and counterclockwise.  Complete __________ repetitions in each direction. Many people use this exercise to relieve  discomfort in their shoulder as well as to gain range of motion. Repeat __________ times. Complete this exercise __________ times per day. STRETCH - Flexion, Standing  Stand with good posture. With an underhand grip on your right / left hand and an overhand grip on the opposite hand, grasp a broomstick or cane so that your hands are a little more than shoulder-width apart.  Keeping your right / left elbow straight and shoulder muscles relaxed, push the stick with your opposite hand to raise your right / left arm in front of your body and then overhead. Raise your arm until you feel a stretch in your right / left shoulder, but before you have increased shoulder pain.  Try to avoid shrugging your right / left shoulder as your arm rises by keeping your shoulder blade tucked down and toward your mid-back spine. Hold __________ seconds.  Slowly return to the starting position. Repeat __________ times. Complete this exercise __________ times per day. STRETCH - Internal Rotation  Place your right / left hand behind your back, palm-up.  Throw a towel or belt over your opposite shoulder. Grasp the towel/belt with your right / left hand.  While keeping an upright posture, gently pull up on the towel/belt until you feel a stretch in the front of your right / left shoulder.  Avoid shrugging your right / left shoulder as your arm rises by keeping your shoulder blade tucked down and toward your mid-back spine.  Hold __________. Release the stretch by lowering your opposite hand. Repeat __________ times. Complete this exercise __________ times per day. STRETCH - External Rotation and Abduction  Stagger your stance through a doorframe. It does not matter which foot is forward.  As instructed by your physician, physical therapist or athletic trainer, place your hands:  And forearms above your head and on the door frame.  And forearms at head-height and on the door frame.  At elbow-height and on the  door frame.  Keeping your head and chest upright and your stomach muscles tight to prevent over-extending your low-back, slowly shift your weight onto your front foot until you feel a stretch across your chest and/or in the front of your shoulders.  Hold __________ seconds. Shift your weight to your back foot to release the stretch. Repeat __________ times. Complete this stretch __________ times per day.  STRENGTHENING EXERCISES  These exercises may help you when beginning to rehabilitate your injury. They may resolve your symptoms with or without further involvement from your physician, physical therapist or athletic trainer. While completing these exercises, remember:   Muscles can gain both the endurance and the strength needed for everyday activities through controlled exercises.  Complete these exercises as instructed by your physician,  physical therapist or athletic trainer. Progress the resistance and repetitions only as guided.  You may experience muscle soreness or fatigue, but the pain or discomfort you are trying to eliminate should never worsen during these exercises. If this pain does worsen, stop and make certain you are following the directions exactly. If the pain is still present after adjustments, discontinue the exercise until you can discuss the trouble with your clinician.  If advised by your physician, during your recovery, avoid activity or exercises which involve actions that place your right / left hand or elbow above your head or behind your back or head. These positions stress the tissues which are trying to heal. STRENGTH - Scapular Depression and Adduction  With good posture, sit on a firm chair. Supported your arms in front of you with pillows, arm rests or a table top. Have your elbows in line with the sides of your body.  Gently draw your shoulder blades down and toward your mid-back spine. Gradually increase the tension without tensing the muscles along the top of  your shoulders and the back of your neck.  Hold for __________ seconds. Slowly release the tension and relax your muscles completely before completing the next repetition.  After you have practiced this exercise, remove the arm support and complete it in standing as well as sitting. Repeat __________ times. Complete this exercise __________ times per day.  STRENGTH - External Rotators  Secure a rubber exercise band/tubing to a fixed object so that it is at the same height as your right / left elbow when you are standing or sitting on a firm surface.  Stand or sit so that the secured exercise band/tubing is at your side that is not injured.  Bend your elbow 90 degrees. Place a folded towel or small pillow under your right / left arm so that your elbow is a few inches away from your side.  Keeping the tension on the exercise band/tubing, pull it away from your body, as if pivoting on your elbow. Be sure to keep your body steady so that the movement is only coming from your shoulder rotating.  Hold __________ seconds. Release the tension in a controlled manner as you return to the starting position. Repeat __________ times. Complete this exercise __________ times per day.  STRENGTH - Supraspinatus  Stand or sit with good posture. Grasp a __________ weight or an exercise band/tubing so that your hand is "thumbs-up," like when you shake hands.  Slowly lift your right / left hand from your thigh into the air, traveling about 30 degrees from straight out at your side. Lift your hand to shoulder height or as far as you can without increasing any shoulder pain. Initially, many people do not lift their hands above shoulder height.  Avoid shrugging your right / left shoulder as your arm rises by keeping your shoulder blade tucked down and toward your mid-back spine.  Hold for __________ seconds. Control the descent of your hand as you slowly return to your starting position. Repeat __________ times.  Complete this exercise __________ times per day.  STRENGTH - Shoulder Extensors  Secure a rubber exercise band/tubing so that it is at the height of your shoulders when you are either standing or sitting on a firm arm-less chair.  With a thumbs-up grip, grasp an end of the band/tubing in each hand. Straighten your elbows and lift your hands straight in front of you at shoulder height. Step back away from the secured end of band/tubing  until it becomes tense.  Squeezing your shoulder blades together, pull your hands down to the sides of your thighs. Do not allow your hands to go behind you.  Hold for __________ seconds. Slowly ease the tension on the band/tubing as you reverse the directions and return to the starting position. Repeat __________ times. Complete this exercise __________ times per day.  STRENGTH - Scapular Retractors  Secure a rubber exercise band/tubing so that it is at the height of your shoulders when you are either standing or sitting on a firm arm-less chair.  With a palm-down grip, grasp an end of the band/tubing in each hand. Straighten your elbows and lift your hands straight in front of you at shoulder height. Step back away from the secured end of band/tubing until it becomes tense.  Squeezing your shoulder blades together, draw your elbows back as you bend them. Keep your upper arm lifted away from your body throughout the exercise.  Hold __________ seconds. Slowly ease the tension on the band/tubing as you reverse the directions and return to the starting position. Repeat __________ times. Complete this exercise __________ times per day. STRENGTH - Scapular Depressors  Find a sturdy chair without wheels, such as a from a dining room table.  Keeping your feet on the floor, lift your bottom from the seat and lock your elbows.  Keeping your elbows straight, allow gravity to pull your body weight down. Your shoulders will rise toward your ears.  Raise your body  against gravity by drawing your shoulder blades down your back, shortening the distance between your shoulders and ears. Although your feet should always maintain contact with the floor, your feet should progressively support less body weight as you get stronger.  Hold __________ seconds. In a controlled and slow manner, lower your body weight to begin the next repetition. Repeat __________ times. Complete this exercise __________ times per day.  Document Released: 03/08/2005 Document Revised: 07/17/2011 Document Reviewed: 08/06/2008 [facility] Patient Information 2015 [place], [place]. This information is not intended to replace advice given to you by your health care provider. Make sure you discuss any questions you have with your health care provider.

## 2014-09-17 LAB — HM DIABETES EYE EXAM

## 2014-09-24 ENCOUNTER — Encounter: Payer: Self-pay | Admitting: Family Medicine

## 2015-07-12 ENCOUNTER — Emergency Department (HOSPITAL_COMMUNITY)
Admission: EM | Admit: 2015-07-12 | Discharge: 2015-07-12 | Disposition: A | Discharge status: Home / Self Care | Payer: BLUE CROSS/BLUE SHIELD | Attending: Physician Assistant | Admitting: Physician Assistant

## 2015-07-12 ENCOUNTER — Emergency Department (HOSPITAL_COMMUNITY): Payer: BLUE CROSS/BLUE SHIELD

## 2015-07-12 ENCOUNTER — Encounter (HOSPITAL_COMMUNITY): Payer: Self-pay

## 2015-07-12 DIAGNOSIS — Z791 Long term (current) use of non-steroidal anti-inflammatories (NSAID): Secondary | ICD-10-CM | POA: Insufficient documentation

## 2015-07-12 DIAGNOSIS — Z7984 Long term (current) use of oral hypoglycemic drugs: Secondary | ICD-10-CM | POA: Insufficient documentation

## 2015-07-12 DIAGNOSIS — Z87891 Personal history of nicotine dependence: Secondary | ICD-10-CM | POA: Insufficient documentation

## 2015-07-12 DIAGNOSIS — Z8781 Personal history of (healed) traumatic fracture: Secondary | ICD-10-CM | POA: Insufficient documentation

## 2015-07-12 DIAGNOSIS — S01112A Laceration without foreign body of left eyelid and periocular area, initial encounter: Secondary | ICD-10-CM | POA: Insufficient documentation

## 2015-07-12 DIAGNOSIS — Y9289 Other specified places as the place of occurrence of the external cause: Secondary | ICD-10-CM | POA: Insufficient documentation

## 2015-07-12 DIAGNOSIS — Y9389 Activity, other specified: Secondary | ICD-10-CM | POA: Diagnosis not present

## 2015-07-12 DIAGNOSIS — E119 Type 2 diabetes mellitus without complications: Secondary | ICD-10-CM | POA: Diagnosis not present

## 2015-07-12 DIAGNOSIS — Z8619 Personal history of other infectious and parasitic diseases: Secondary | ICD-10-CM | POA: Diagnosis not present

## 2015-07-12 DIAGNOSIS — S01412A Laceration without foreign body of left cheek and temporomandibular area, initial encounter: Secondary | ICD-10-CM | POA: Insufficient documentation

## 2015-07-12 DIAGNOSIS — S21112A Laceration without foreign body of left front wall of thorax without penetration into thoracic cavity, initial encounter: Secondary | ICD-10-CM | POA: Diagnosis not present

## 2015-07-12 DIAGNOSIS — E669 Obesity, unspecified: Secondary | ICD-10-CM | POA: Diagnosis not present

## 2015-07-12 DIAGNOSIS — IMO0002 Reserved for concepts with insufficient information to code with codable children: Secondary | ICD-10-CM

## 2015-07-12 DIAGNOSIS — Z23 Encounter for immunization: Secondary | ICD-10-CM | POA: Diagnosis not present

## 2015-07-12 DIAGNOSIS — Y998 Other external cause status: Secondary | ICD-10-CM | POA: Diagnosis not present

## 2015-07-12 DIAGNOSIS — S0993XA Unspecified injury of face, initial encounter: Secondary | ICD-10-CM | POA: Diagnosis present

## 2015-07-12 DIAGNOSIS — Z8614 Personal history of Methicillin resistant Staphylococcus aureus infection: Secondary | ICD-10-CM | POA: Insufficient documentation

## 2015-07-12 MED ORDER — ERYTHROMYCIN 5 MG/GM OP OINT: TOPICAL_OINTMENT | OPHTHALMIC | Status: DC

## 2015-07-12 MED ORDER — OXYCODONE-ACETAMINOPHEN 5-325 MG PO TABS
1.0000 | ORAL_TABLET | Freq: Once | ORAL | Status: AC
  Administered 2015-07-12: 1 via ORAL

## 2015-07-12 MED ORDER — TETRACAINE HCL 0.5 % OP SOLN
2.0000 [drp] | Freq: Once | OPHTHALMIC | Status: AC
  Administered 2015-07-12: 2 [drp] via OPHTHALMIC
  Filled 2015-07-12: qty 2

## 2015-07-12 MED ORDER — LIDOCAINE HCL (PF) 1 % IJ SOLN
5.0000 mL | Freq: Once | INTRAMUSCULAR | Status: AC
  Administered 2015-07-12: 5 mL
  Filled 2015-07-12: qty 5

## 2015-07-12 MED ORDER — TETANUS-DIPHTH-ACELL PERTUSSIS 5-2.5-18.5 LF-MCG/0.5 IM SUSP
0.5000 mL | Freq: Once | INTRAMUSCULAR | Status: AC
  Administered 2015-07-12: 0.5 mL via INTRAMUSCULAR
  Filled 2015-07-12: qty 0.5

## 2015-07-12 MED ORDER — CEPHALEXIN 250 MG PO CAPS: 250.0000 mg | ORAL_CAPSULE | Freq: Four times a day (QID) | ORAL | Status: DC

## 2015-07-12 MED ORDER — FLUORESCEIN SODIUM 1 MG OP STRP
1.0000 | ORAL_STRIP | Freq: Once | OPHTHALMIC | Status: AC
  Administered 2015-07-12: 1 via OPHTHALMIC
  Filled 2015-07-12: qty 1

## 2015-07-12 MED ORDER — OXYCODONE-ACETAMINOPHEN 5-325 MG PO TABS: 1.0000 | ORAL_TABLET | Freq: Three times a day (TID) | ORAL | Status: DC | PRN

## 2015-07-12 MED ORDER — LIDOCAINE-EPINEPHRINE (PF) 2 %-1:200000 IJ SOLN
10.0000 mL | Freq: Once | INTRAMUSCULAR | Status: DC
  Filled 2015-07-12: qty 10

## 2015-07-12 MED ORDER — OXYCODONE-ACETAMINOPHEN 5-325 MG PO TABS
ORAL_TABLET | ORAL | Status: AC
  Filled 2015-07-12: qty 1
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
## 2015-07-12 NOTE — ED Notes (Signed)
[facility] @ [phone], 18:35pm.
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
## 2015-07-12 NOTE — ED Notes (Signed)
Patient has returned from being out of the department; placed patient back on continuous pulse oximetry and blood pressure cuff; visual acuity performed

## 2015-07-12 NOTE — ED Notes (Signed)
Suture cart at bedside 

## 2015-07-12 NOTE — ED Notes (Signed)
Patient placed on continuous pulse oximetry and blood pressure cuff; visitor at bedside 

## 2015-07-12 NOTE — Discharge Instructions (Signed)
Please read and follow all provided instructions.  Your diagnoses today include:  1. Laceration    Tests performed today include:  CT of the affected area that did not show any foreign bodies or broken bones  Vital signs. See below for your results today.   Medications prescribed:   Take any prescribed medications only as directed.  Place erythromycin ointment on eyelid two times per day for 7 days   Home care instructions:   Follow any educational materials and wound care instructions contained in this packet.   Follow-up instructions: Dr. Sherryll BurgerShah tomorrow at 10am for reevaluation    Return instructions:  Return to the Emergency Department if you have:  Fever  Worsening pain  Worsening swelling of the wound  Pus draining from the wound  Redness of the skin that moves away from the wound, especially if it streaks away from the affected area   Any other emergent concerns  Your vital signs today were: BP 112/70 mmHg   Pulse 87   Temp(Src) 99.3 F (37.4 C) (Oral)   Resp 17   SpO2 100% If your blood pressure (BP) was elevated above 135/85 this visit, please have this repeated by your doctor within one month. --------------

## 2015-07-12 NOTE — ED Notes (Signed)
optho at bedside

## 2015-07-12 NOTE — ED Notes (Signed)
Patient brought back to room with family in tow; patient getting undressed and into a gown at this time 
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
## 2016-04-14 ENCOUNTER — Ambulatory Visit (INDEPENDENT_AMBULATORY_CARE_PROVIDER_SITE_OTHER): Payer: BLUE CROSS/BLUE SHIELD

## 2016-04-14 ENCOUNTER — Encounter (HOSPITAL_COMMUNITY): Payer: Self-pay | Admitting: Emergency Medicine

## 2016-04-14 ENCOUNTER — Ambulatory Visit (HOSPITAL_COMMUNITY)
Admission: EM | Admit: 2016-04-14 | Discharge: 2016-04-14 | Disposition: A | Discharge status: Home / Self Care | Payer: BLUE CROSS/BLUE SHIELD | Attending: Family Medicine | Admitting: Family Medicine

## 2016-04-14 DIAGNOSIS — J069 Acute upper respiratory infection, unspecified: Secondary | ICD-10-CM

## 2016-04-14 DIAGNOSIS — J40 Bronchitis, not specified as acute or chronic: Secondary | ICD-10-CM | POA: Diagnosis not present

## 2016-04-14 DIAGNOSIS — B9789 Other viral agents as the cause of diseases classified elsewhere: Secondary | ICD-10-CM

## 2016-04-14 MED ORDER — HYDROCOD POLST-CPM POLST ER 10-8 MG/5ML PO SUER: 5.0000 mL | Freq: Every evening | ORAL | 0 refills | Status: DC | PRN

## 2016-04-14 MED ORDER — IPRATROPIUM BROMIDE 0.06 % NA SOLN: 2.0000 | Freq: Four times a day (QID) | NASAL | 0 refills | Status: DC

## 2016-04-14 NOTE — Discharge Instructions (Signed)
You likely have a viral upper respiratory infection with viral bronchitis. Try Tussionex at night at needed for cough and Atrovent nasal spray for your symptoms. Your chest pain is due to inflammation of your chest wall which is very common with a cold. You can try Tylenol for this if needed. Try the following as well:  Getting lots of rest and drinking plenty of liquids Drinking hot tea with honey for your cough Sucking on cough drops or hard candy Breathing in warm, moist air, such as in the shower, over a kettle, or from a humidifier

## 2016-04-14 NOTE — ED Triage Notes (Signed)
Here for cold sx onset 5 days associated w/CP pain, SOB, prod cough  Denies fevers... Voices no other concerns  A&O x4... NAD

## 2016-04-14 NOTE — ED Provider Notes (Signed)
CSN: [id number]     Arrival date & time 04/14/16  1110 History   None    Chief Complaint  Patient presents with  . URI   (Consider location/radiation/quality/duration/timing/severity/associated sxs/prior Treatment) HPI  Kaylee Holmes is a 25 yo female with PMH pre-diabetes, obesity who presents with cold like symptoms and shortness of breath. Reports of rhinorrhea, congestion, sore throat, productive cough since Monday. No post-nasal drip. Her cough worsened through out the week and last night she had difficulty breathing and chest pain with cough. Her chest pain usually occurs after she coughs; it is central without radiation. Reports it worsens with deep inspiration and expiration. No pain with twisting her upper body. No fevers at home. No sick contacts to her recollection. She tried delsym last night but is unsure if it helped as she fell asleep. She reports of cold sweats Monday and Tuesday. No nausea/vomting No diarreha. Normal Appetite. Reports of history of asthma as an infant but has not had issues with this as she got older.    Past Medical History:  Diagnosis Date  . Chicken pox   . Diabetes mellitus    "pre diabetic"  . 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 Obesity   . Thumb fracture    left   Past Surgical History:  Procedure Laterality Date  . SPINAL FUSION  2010   Family History  Problem Relation Age of Onset  . Ulcerative colitis Father   . Rashes / Skin problems Father     psoriasis  . Sarcoidosis Father     skin  . Asthma Other   . Allergies Other   . Rashes / Skin problems Other     aczema  . Diabetes Other    Social History  Substance Use Topics  . Smoking status: Former Games developermoker  . Smokeless tobacco: Never Used  . Alcohol use Yes   OB History    No data available     Review of Systems: as noted above  Allergies  Sulfamethoxazole-trimethoprim  Home Medications   Prior to Admission medications   Medication  Sig Start Date End Date Taking? Authorizing Provider  chlorpheniramine-HYDROcodone (TUSSIONEX PENNKINETIC ER) 10-8 MG/5ML SUER Take 5 mLs by mouth at bedtime as needed for cough. 04/14/16   Palma HolterKanishka G Gunadasa, MD  ibuprofen (ADVIL,MOTRIN) 400 MG tablet Take Ibuprofen 400 mg four times per day with food or milk as needed for pain. 08/31/11   Carleene CooperAlan Davidson, MD  ibuprofen (MOTRIN IB) 200 MG tablet Take 200 mg by mouth every 6 (six) hours as needed.    Historical Provider, MD  ipratropium (ATROVENT) 0.06 % nasal spray Place 2 sprays into both nostrils 4 (four) times daily. 04/14/16   Palma HolterKanishka G Gunadasa, MD  metFORMIN (GLUCOPHAGE-XR) 500 MG 24 hr tablet Take 1 tablet (500 mg total) by mouth daily with breakfast. 09/06/11 09/05/12  Madelin HeadingsWanda K Panosh, MD  methocarbamol (ROBAXIN) 500 MG tablet Take 1-2 tablets (500-1,000 mg total) by mouth every 6 (six) hours as needed for muscle spasms. 07/07/14   Ozella Rocksavid J Merrell, MD   Meds Ordered and Administered this Visit  Medications - No data to display  BP 109/79 (BP Location: Left Arm)   Pulse 83   Temp 98.7 F (37.1 C) (Oral)   Resp 18   LMP 03/21/2016   SpO2 100%  No data found.   Physical Exam  Constitutional: She is oriented to person, place, and time. She appears well-developed and  well-nourished. No distress.  HENT:  Right Ear: External ear normal.  Left Ear: External ear normal.  Nose: Nose normal.  Mouth/Throat: No oropharyngeal exudate.  Mild erythema of pharynx without any exudates noted. Moist mucous membranes. Tympanic membranes normal bilaterally. Nasal turbinates normal  Eyes: Conjunctivae are normal.  Neck: Normal range of motion. Neck supple.  Cardiovascular: Normal rate and regular rhythm.  Exam reveals no gallop and no friction rub.   No murmur heard. Pulmonary/Chest: Effort normal and breath sounds normal. No respiratory distress. She has no wheezes. She has no rales. She exhibits tenderness.  Abdominal: Soft. There is no tenderness.   Lymphadenopathy:    She has no cervical adenopathy.  Neurological: She is alert and oriented to person, place, and time.  Skin: Skin is warm and dry. No rash noted. She is not diaphoretic.  Psychiatric: She has a normal mood and affect. Her behavior is normal.   Urgent Care Course   Clinical Course   Symptoms consistent with URI with bronchitis likely viral in etiology. CXR is unremarkable for pneumonia. Discussed symptomatic therapy with patient. Will also prescribe Tussionex qHS PRN and Atrovent. Discussed symptomatic treatment with tea and honey, humidifier, continued oral hydration. Patient's vitals are stable and patient is stable for discharge to home. Return precautions discussed.    Procedures  Labs Review Labs Reviewed - No data to display  Imaging Review Dg Chest 2 View  Result Date: 04/14/2016 CLINICAL DATA:  Cough, shortness of breath EXAM: CHEST  2 VIEW COMPARISON:  08/24/2012 FINDINGS: Lungs are clear.  No pleural effusion or pneumothorax. The heart is normal in size. Visualized osseous structures are within normal limits. IMPRESSION: Normal chest radiographs. Electronically Signed   By: Charline BillsSriyesh  Krishnan M.D.   On: 04/14/2016 12:39    MDM   1. Viral URI with cough   2. Bronchitis    Symptoms consistent with URI with bronchitis likely viral in etiology. CXR is unremarkable for pneumonia. Discussed symptomatic therapy with patient. Will also prescribe Tussionex qHS PRN and Atrovent. Discussed symptomatic treatment with tea and honey, humidifier, continued oral hydration. Patient's vitals are stable and patient is stable for discharge to home. Return precautions discussed.   Discussed presentation, exam, assessment, and plan with Ms. Tama GanderKatherine Schorr, NP.   Palma HolterKanishka G Gunadasa, MD  PGY 2 Family Medicine    Palma HolterKanishka G Gunadasa, MD 04/14/16 1255
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
## 2016-08-30 ENCOUNTER — Ambulatory Visit (INDEPENDENT_AMBULATORY_CARE_PROVIDER_SITE_OTHER): Payer: BLUE CROSS/BLUE SHIELD | Admitting: Internal Medicine

## 2016-08-30 ENCOUNTER — Encounter: Payer: Self-pay | Admitting: Internal Medicine

## 2016-08-30 VITALS — BP 98/78 | HR 87 | Temp 97.7°F | Ht 65.5 in | Wt 217.4 lb

## 2016-08-30 DIAGNOSIS — Z113 Encounter for screening for infections with a predominantly sexual mode of transmission: Secondary | ICD-10-CM | POA: Diagnosis not present

## 2016-08-30 DIAGNOSIS — D649 Anemia, unspecified: Secondary | ICD-10-CM | POA: Diagnosis not present

## 2016-08-30 DIAGNOSIS — Z299 Encounter for prophylactic measures, unspecified: Secondary | ICD-10-CM

## 2016-08-30 DIAGNOSIS — Z87891 Personal history of nicotine dependence: Secondary | ICD-10-CM | POA: Diagnosis not present

## 2016-08-30 DIAGNOSIS — G479 Sleep disorder, unspecified: Secondary | ICD-10-CM | POA: Diagnosis not present

## 2016-08-30 DIAGNOSIS — E786 Lipoprotein deficiency: Secondary | ICD-10-CM

## 2016-08-30 DIAGNOSIS — H02402 Unspecified ptosis of left eyelid: Secondary | ICD-10-CM

## 2016-08-30 DIAGNOSIS — N926 Irregular menstruation, unspecified: Secondary | ICD-10-CM | POA: Diagnosis not present

## 2016-08-30 DIAGNOSIS — Z6835 Body mass index (BMI) 35.0-35.9, adult: Secondary | ICD-10-CM

## 2016-08-30 DIAGNOSIS — E8881 Metabolic syndrome: Secondary | ICD-10-CM | POA: Diagnosis not present

## 2016-08-30 NOTE — Patient Instructions (Addendum)
Attention to sleep hygiene to include making the room very dark considering white noise avoiding backlighting for a number of hours before going to bed. Make appointment for fasting lab work and then a follow-up well woman exam and we can do a Pap smear at that time. We'll be checking for risk of diabetes cholesterol etc.  Avoid caffiene after 2  Pm  Ok to try melatonin 2-3 hours pre sleep  Time.     Insomnia Insomnia is a sleep disorder that makes it difficult to fall asleep or to stay asleep. Insomnia can cause tiredness (fatigue), low energy, difficulty concentrating, mood swings, and poor performance at work or school. There are three different ways to classify insomnia:  Difficulty falling asleep.  Difficulty staying asleep.  Waking up too early in the morning. Any type of insomnia can be long-term (chronic) or short-term (acute). Both are common. Short-term insomnia usually lasts for three months or less. Chronic insomnia occurs at least three times a week for longer than three months. What are the causes? Insomnia may be caused by another condition, situation, or substance, such as:  Anxiety.  Certain medicines.  Gastroesophageal reflux disease (GERD) or other gastrointestinal conditions.  Asthma or other breathing conditions.  Restless legs syndrome, sleep apnea, or other sleep disorders.  Chronic pain.  Menopause. This may include hot flashes.  Stroke.  Abuse of alcohol, tobacco, or illegal drugs.  Depression.  Caffeine.  Neurological disorders, such as Alzheimer disease.  An overactive thyroid (hyperthyroidism). The cause of insomnia may not be known. What increases the risk? Risk factors for insomnia include:  Gender. Women are more commonly affected than men.  Age. Insomnia is more common as you get older.  Stress. This may involve your professional or personal life.  Income. Insomnia is more common in people with lower income.  Lack of  exercise.  Irregular work schedule or night shifts.  Traveling between different time zones. What are the signs or symptoms? If you have insomnia, trouble falling asleep or trouble staying asleep is the main symptom. This may lead to other symptoms, such as:  Feeling fatigued.  Feeling nervous about going to sleep.  Not feeling rested in the morning.  Having trouble concentrating.  Feeling irritable, anxious, or depressed. How is this treated? Treatment for insomnia depends on the cause. If your insomnia is caused by an underlying condition, treatment will focus on addressing the condition. Treatment may also include:  Medicines to help you sleep.  Counseling or therapy.  Lifestyle adjustments. Follow these instructions at home:  Take medicines only as directed by your health care provider.  Keep regular sleeping and waking hours. Avoid naps.  Keep a sleep diary to help you and your health care provider figure out what could be causing your insomnia. Include:  When you sleep.  When you wake up during the night.  How well you sleep.  How rested you feel the next day.  Any side effects of medicines you are taking.  What you eat and drink.  Make your bedroom a comfortable place where it is easy to fall asleep:  Put up shades or special blackout curtains to block light from outside.  Use a white noise machine to block noise.  Keep the temperature cool.  Exercise regularly as directed by your health care provider. Avoid exercising right before bedtime.  Use relaxation techniques to manage stress. Ask your health care provider to suggest some techniques that may work well for you. These may include:  Breathing exercises.  Routines to release muscle tension.  Visualizing peaceful scenes.  Cut back on alcohol, caffeinated beverages, and cigarettes, especially close to bedtime. These can disrupt your sleep.  Do not overeat or eat spicy foods right before  bedtime. This can lead to digestive discomfort that can make it hard for you to sleep.  Limit screen use before bedtime. This includes:  Watching TV.  Using your smartphone, tablet, and computer.  Stick to a routine. This can help you fall asleep faster. Try to do a quiet activity, brush your teeth, and go to bed at the same time each night.  Get out of bed if you are still awake after 15 minutes of trying to sleep. Keep the lights down, but try reading or doing a quiet activity. When you feel sleepy, go back to bed.  Make sure that you drive carefully. Avoid driving if you feel very sleepy.  Keep all follow-up appointments as directed by your health care provider. This is important. Contact a health care provider if:  You are tired throughout the day or have trouble in your daily routine due to sleepiness.  You continue to have sleep problems or your sleep problems get worse. Get help right away if:  You have serious thoughts about hurting yourself or someone else. This information is not intended to replace advice given to you by your health care provider. Make sure you discuss any questions you have with your health care provider. Document Released: 04/21/2000 Document Revised: 09/24/2015 Document Reviewed: 01/23/2014 Elsevier Interactive Patient Education  2017 [employer].

## 2016-08-31 LAB — CBC WITH DIFFERENTIAL/PLATELET
BASOS ABS: 0 10*3/uL (ref 0.0–0.1)
BASOS PCT: 0.5 % (ref 0.0–3.0)
EOS ABS: 0.1 10*3/uL (ref 0.0–0.7)
Eosinophils Relative: 1.6 % (ref 0.0–5.0)
HCT: 35.2 % — ABNORMAL LOW (ref 36.0–46.0)
Hemoglobin: 11.4 g/dL — ABNORMAL LOW (ref 12.0–15.0)
Lymphocytes Relative: 32.9 % (ref 12.0–46.0)
Lymphs Abs: 2.5 10*3/uL (ref 0.7–4.0)
MCHC: 32.2 g/dL (ref 30.0–36.0)
MCV: 84.8 fl (ref 78.0–100.0)
Monocytes Absolute: 0.5 10*3/uL (ref 0.1–1.0)
Monocytes Relative: 6.1 % (ref 3.0–12.0)
NEUTROS ABS: 4.5 10*3/uL (ref 1.4–7.7)
NEUTROS PCT: 58.9 % (ref 43.0–77.0)
PLATELETS: 348 10*3/uL (ref 150.0–400.0)
RBC: 4.15 Mil/uL (ref 3.87–5.11)
RDW: 16.8 % — AB (ref 11.5–15.5)
WBC: 7.6 10*3/uL (ref 4.0–10.5)

## 2016-08-31 LAB — HEPATIC FUNCTION PANEL
ALT: 8 U/L (ref 0–35)
AST: 12 U/L (ref 0–37)
Albumin: 3.7 g/dL (ref 3.5–5.2)
Alkaline Phosphatase: 52 U/L (ref 39–117)
BILIRUBIN DIRECT: 0.1 mg/dL (ref 0.0–0.3)
BILIRUBIN TOTAL: 0.3 mg/dL (ref 0.2–1.2)
TOTAL PROTEIN: 6.7 g/dL (ref 6.0–8.3)

## 2016-08-31 LAB — BASIC METABOLIC PANEL
BUN: 9 mg/dL (ref 6–23)
CALCIUM: 9.2 mg/dL (ref 8.4–10.5)
CHLORIDE: 108 meq/L (ref 96–112)
CO2: 29 meq/L (ref 19–32)
Creatinine, Ser: 0.58 mg/dL (ref 0.40–1.20)
GFR: 163.52 mL/min (ref >60.00)
Glucose, Bld: 101 mg/dL — ABNORMAL HIGH (ref 70–99)
Potassium: 4.2 mEq/L (ref 3.5–5.1)
SODIUM: 140 meq/L (ref 135–145)

## 2016-08-31 LAB — LIPID PANEL
CHOL/HDL RATIO: 4
CHOLESTEROL: 130 mg/dL (ref 0–200)
HDL: 34.4 mg/dL — AB (ref >39.00)
LDL Cholesterol: 87 mg/dL (ref 0–99)
NonHDL: 96.03
TRIGLYCERIDES: 45 mg/dL (ref 0.0–149.0)
VLDL: 9 mg/dL (ref 0.0–40.0)

## 2016-08-31 LAB — HEMOGLOBIN A1C: Hgb A1c MFr Bld: 5.5 % (ref 4.6–6.5)

## 2016-08-31 LAB — TSH: TSH: 0.76 u[IU]/mL (ref 0.35–4.50)

## 2016-08-31 NOTE — Addendum Note (Signed)
Addended by: Charna Elizabeth on: 08/31/2016 10:39 AM   Modules accepted: Orders

## 2016-09-01 LAB — INSULIN, FASTING: INSULIN FASTING, SERUM: 17.9 u[IU]/mL (ref 2.0–19.6)

## 2016-09-01 LAB — GC/CHLAMYDIA PROBE AMP
CT Probe RNA: NOT DETECTED
GC Probe RNA: NOT DETECTED

## 2016-09-01 LAB — HIV ANTIBODY (ROUTINE TESTING W REFLEX): HIV: NONREACTIVE

## 2016-09-02 LAB — RPR
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
## 2017-03-27 LAB — HM DIABETES EYE EXAM

## 2017-03-28 NOTE — Progress Notes (Signed)
Chief Complaint  Patient presents with  . Gynecologic Exam    HPI: Patient  Kaylee Holmes  26 y.o. comes in today for Preventive Health Care visit  And gyne exam   Sick  With cold now .  3-4 days  Cough and  Congestion  No fever   Not taking metformin at this time  Ran out  Would take  ( mom took some on meds) Still some tobaco  Periods  See below  Sleep still interrupted some  Snore no osa   Health Maintenance  Topic Date Due  . PAP SMEAR  02/17/2013  . INFLUENZA VACCINE  12/06/2016  . TETANUS/TDAP  07/11/2025  . HIV Screening  Completed   Health Maintenance Review LIFESTYLE:  Exercise:  Moving  Works  In [employer] .  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     ROS:  GEN/ HEENT: No fever, significant weight changes sweats headaches vision problems hearing changes, CV/ PULM; No chest pain shortness of breath, syncope,edema  change in exercise tolerance. GI /GU: No adominal pain, vomiting, change in bowel habits. No blood in the stool. No significant GU symptoms. SKIN/HEME: ,no acute skin rashes suspicious lesions or bleeding. No lymphadenopathy, nodules, masses.  NEURO/ PSYCH:  No neurologic signs such as weakness numbness. No depression anxiety. IMM/ Allergy: No unusual infections.  Allergy .   REST of 12 system review negative except as per HPI   Past Medical History:  Diagnosis Date  . Chicken pox   . Diabetes mellitus    "pre diabetic"  . 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 Obesity   . Thumb fracture    left  . Varicella     Past Surgical History:  Procedure Laterality Date  . SPINAL FUSION  2010    Family History  Problem Relation Age of Onset  . Ulcerative colitis Father   . Rashes / Skin problems Father    psoriasis  . Sarcoidosis Father        skin  . Asthma Other   . Allergies Other   . Rashes / Skin problems Other        aczema  . Diabetes Other   . Bipolar disorder Mother   . Bipolar disorder Sister     Social History   Socioeconomic History  . Marital status: Single    Spouse name: None  . Number of children: None  . Years of education: None  . Highest education level: None  Social Needs  . Financial resource strain: None  . Food insecurity - worry: None  . Food insecurity - inability: None  . Transportation needs - medical: None  . Transportation needs - non-medical: None  Occupational History  . None  Tobacco Use  . Smoking status: Light Tobacco Smoker  . Smokeless tobacco: Never Used  Substance and Sexual Activity  . Alcohol use: No  . Drug use: No  . Sexual activity: Yes    Birth control/protection: None  Other Topics Concern  . None  Social History Narrative   Neg tad   Parents Cassandra and Elberta Fortis   [facility] of 4 no pets. No  ets   Plans college in IT      Hs grad  40 hour [employer]    Limited exercise from back      Mom's cell number [phone]   [id number]   Neg ad neg fa     Outpatient Medications Prior to Visit  Medication Sig Dispense Refill  . metFORMIN (GLUCOPHAGE-XR) 500 MG 24 hr tablet Take 1 tablet (500 mg total) by mouth daily with breakfast. 90 tablet 1  . methocarbamol (ROBAXIN) 500 MG tablet Take 1-2 tablets (500-1,000 mg total) by mouth every 6 (six) hours as needed for muscle spasms. (Patient not taking: Reported on 04/03/2017) 60 tablet 0   No facility-administered medications prior to visit.      EXAM:  BP 116/72 (BP Location: Right Arm, Patient Position: Sitting, Cuff Size: Normal)   Pulse (!) 103   Temp 98.4 F (36.9 C) (Oral)   Ht '5\' 5"'$  (1.651 m)   Wt 219 lb 6.4 oz (99.5 kg)   BMI 36.51 kg/m   Body mass index is 36.51 kg/m. Wt Readings from Last 3 Encounters:  04/03/17 219 lb 6.4 oz (99.5 kg)  08/30/16 217 lb 6.4 oz (98.6 kg)    08/23/12 235 lb (106.6 kg)    Physical Exam: Vital signs reviewed DDU:KGUR is a well-developed well-nourished alert cooperative    who appearsr stated age in no acute distress.  Ur congestion cough ocass looks well unlabored resp HEENT: normocephalic atraumatic , Eyes: PERRL EOM's full, conjunctiva clear,ptosis left eye lid   Nares: paten,t no deformity discharge or tenderness., Ears: no deformity EAC's clear TMs with normal landmarks. Nares congested .  Mouth: clear OP, no lesions, edema.  Moist mucous membranes. Dentition in adequate repair. NECK: supple without masses, thyromegaly or bruits. CHEST/PULM:  Clear to auscultation and percussion breath sounds equal no wheeze , rales or rhonchi. No chest wall deformities or tenderness. Breast: normal by inspection . No dimpling, discharge, masses, tenderness or discharge . CV: PMI is nondisplaced, S1 S2 no gallops, murmurs, rubs. Peripheral pulses are full without delay.No JVD .  ABDOMEN: Bowel sounds normal nontender  No guard or rebound, no hepato splenomegal no CVA tenderness.  No hernia. Extremtities:  No clubbing cyanosis or edema, no acute joint swelling or redness no focal atrophy NEURO:  Oriented x3, cranial nerves 3-12 appear to be intact, no obvious focal weakness,gait within normal limits no abnormal reflexes or asymmetrical SKIN: No acute rashes normal turgor, color, no bruising or petechiae.  Some acnthosis  Pigment  neck  PSYCH: Oriented, good eye contact, no obvious depression anxiety, cognition and judgment appear normal. LN: no cervical axillary inguinal adenopathy Pelvic: NL ext GU, labia clear without lesions or rash some hypopig patch labia  . Vagina no lesions .Cervix: clear  UTERUS: Neg CMT Adnexa:  clear no masses . PAP done   Lab Results  Component Value Date   WBC 7.6 08/31/2016   HGB 11.4 (L) 08/31/2016   HCT 35.2 (L) 08/31/2016   PLT 348.0 08/31/2016   GLUCOSE 101 (H) 08/31/2016   CHOL 130 08/31/2016   TRIG 45.0  08/31/2016   HDL 34.40 (L) 08/31/2016   LDLCALC 87 08/31/2016   ALT 8 08/31/2016   AST 12 08/31/2016   NA 140 08/31/2016   K 4.2 08/31/2016   CL 108 08/31/2016   CREATININE 0.58 08/31/2016   BUN 9 08/31/2016   CO2 29 08/31/2016   TSH 0.76 08/31/2016   HGBA1C 5.5 08/31/2016    BP  Readings from Last 3 Encounters:  04/03/17 116/72  08/30/16 98/78  04/14/16 109/79    Lab results reviewed  From April   ASSESSMENT AND PLAN:  Discussed the following assessment and plan:  Visit for preventive health examination - Plan: Cytology - PAP  BMI 35.0-35.9,adult  Encounter for gynecological examination without abnormal finding  Sleep disturbance  Need for influenza vaccination - Plan: Flu Vaccine QUAD 6+ mos PF IM (Fluarix Quad PF)  URI with cough and congestion  Ptosis of left eyelid  Irregular periods Metabolic syndrome probable offered to go back on metformin  Will send in  Monitor periods     And fu 6 months or as needed   Or call if doing well.  Disc cycling with ocps  Or if need contraception .   Knows advice  stop  Tobacco products .  Sign up for my chart . Patient Care Team: Burnis Medin, MD as PCP - General Modena Jansky, PA-C (Inactive) as Physician Assistant Jovita Gamma, MD (Neurosurgery) Earlie Server, MD (Orthopedic Surgery) Patient Instructions  Your gyne exam is good today  Expect the uri cold to get better  On its own  fyu if fever  Shortness of breath . Or not improving in anotther week.   Flu vaccine ok today . Will notify you when pap results are available. You can take the metformin once a day .  Healthy weight loss to prevent diabetes .       Preventive Care for [place], Female The transition to life after high school as a young adult can be a stressful time with many changes. You may start seeing a primary care physician instead of a pediatrician. This is the time when your health care becomes your responsibility. Preventive  care refers to lifestyle choices and visits with your health care provider that can promote health and wellness. What does preventive care include?  A yearly physical exam. This is also called an annual wellness visit.  Dental exams once or twice a year.  Routine eye exams. Ask your health care provider how often you should have your eyes checked.  Personal lifestyle choices, including: ? Daily care of your teeth and gums. ? Regular physical activity. ? Eating a healthy diet. ? Avoiding tobacco and drug use. ? Avoiding or limiting alcohol use. ? Practicing safe sex. ? Taking vitamin and mineral supplements as recommended by your health care provider. What happens during an annual wellness visit? Preventive care starts with a yearly visit to your primary care physician. The services and screenings done by your health care provider during your annual wellness visit will depend on your overall health, lifestyle risk factors, and family history of disease. Counseling Your health care provider may ask you questions about:  Past medical problems and your family's medical history.  Medicines or supplements you take.  Health insurance and access to health care.  Alcohol, tobacco, and drug use.  Your safety at home, work, or school.  Access to firearms.  Emotional well-being and how you cope with stress.  Relationship well-being.  Diet, exercise, and sleep habits.  Your sexual health and activity.  Your methods of birth control.  Your menstrual cycle.  Your pregnancy history.  Screening You may have the following tests or measurements:  Height, weight, and BMI.  Blood pressure.  Lipid and cholesterol levels.  Tuberculosis skin test.  Skin exam.  Vision and hearing tests.  Screening test for hepatitis.  Screening tests for sexually transmitted  diseases (STDs), if you are at risk.  BRCA-related cancer screening. This may be done if you have a family history of  breast, ovarian, tubal, or peritoneal cancers.  Pelvic exam and Pap test. This may be done every 3 years starting at age 58.  Vaccines Your health care provider may recommend certain vaccines, such as:  Influenza vaccine. This is recommended every year.  Tetanus, diphtheria, and acellular pertussis (Tdap, Td) vaccine. You may need a Td booster every 10 years.  Varicella vaccine. You may need this if you have not been vaccinated.  HPV vaccine. If you are 38 or younger, you may need three doses over 6 months.  Measles, mumps, and rubella (MMR) vaccine. You may need at least one dose of MMR. You may also need a second dose.  Pneumococcal 13-valent conjugate (PCV13) vaccine. You may need this if you have certain conditions and were not previously vaccinated.  Pneumococcal polysaccharide (PPSV23) vaccine. You may need one or two doses if you smoke cigarettes or if you have certain conditions.  Meningococcal vaccine. One dose is recommended if you are age 45-21 years and a first-year college student living in a residence hall, or if you have one of several medical conditions. You may also need additional booster doses.  Hepatitis A vaccine. You may need this if you have certain conditions or if you travel or work in places where you may be exposed to hepatitis A.  Hepatitis B vaccine. You may need this if you have certain conditions or if you travel or work in places where you may be exposed to hepatitis B.  Haemophilus influenzae type b (Hib) vaccine. You may need this if you have certain risk factors.  Talk to your health care provider about which screenings and vaccines you need and how often you need them. What steps can I take to develop healthy behaviors?  Have regular preventive health care visits with your primary care physician and dentist.  Eat a healthy diet.  Drink enough fluid to keep your urine clear or pale yellow.  Stay active. Exercise at least 30 minutes 5 or more  days of the week.  Use alcohol responsibly.  Maintain a healthy weight.  Do not use any products that contain nicotine, such as cigarettes, chewing tobacco, and e-cigarettes. If you need help quitting, ask your health care provider.  Do not use drugs.  Practice safe sex.  Use birth control (contraception) to prevent unwanted pregnancy. If you plan to become pregnant, see your health care provider for a pre-conception visit.  Find healthy ways to manage stress. How can I protect myself from injury? Injuries from violence or accidents are the leading cause of death among young adults and can often be prevented. Take these steps to help protect yourself:  Always wear your seat belt while driving or riding in a vehicle.  Do not drive if you have been drinking alcohol. Do not ride with someone who has been drinking.  Do not drive when you are tired or distracted. Do not text while driving.  Wear a helmet and other protective equipment during sports activities.  If you have firearms in your house, make sure you follow all gun safety procedures.  Seek help if you have been bullied, physically abused, or sexually abused.  Use the Internet responsibly to avoid dangers such as online bullying and online sexual predators.  What can I do to cope with stress? Young adults may face many new challenges that can be stressful,  such as finding a job, going to college, moving away from home, managing money, being in a relationship, getting married, and having children. To manage stress:  Avoid known stressful situations when you can.  Exercise regularly.  Find a stress-reducing activity that works best for you. Examples include meditation, yoga, listening to music, or reading.  Spend time in nature.  Keep a journal to write about your stress and how you respond.  Talk to your health care provider about stress. He or she may suggest counseling.  Spend time with supportive friends or  family.  Do not cope with stress by: ? Drinking alcohol or using drugs. ? Smoking cigarettes. ? Eating.  Where can I get more information? Learn more about preventive care and healthy habits from:  [place] and Gynecologists: KaraokeExchange.nl  U.S. Probation officer Task Force: StageSync.si  National Adolescent and [place]: StrategicRoad.nl  American Academy of Pediatrics Bright Futures: https://brightfutures.MemberVerification.co.za  Society for Adolescent Health and Medicine: MoralBlog.co.za.aspx  PodExchange.nl: ToyLending.fr  This information is not intended to replace advice given to you by your health care provider. Make sure you discuss any questions you have with your health care provider. Document Released: 09/09/2015 Document Revised: 09/30/2015 Document Reviewed: 09/09/2015 Elsevier Interactive Patient Education  2017 [place]. Yaneth Fairbairn M.D.

## 2017-04-03 ENCOUNTER — Ambulatory Visit: Payer: BLUE CROSS/BLUE SHIELD | Admitting: Internal Medicine

## 2017-04-03 ENCOUNTER — Encounter: Payer: Self-pay | Admitting: Internal Medicine

## 2017-04-03 ENCOUNTER — Ambulatory Visit (INDEPENDENT_AMBULATORY_CARE_PROVIDER_SITE_OTHER): Payer: BLUE CROSS/BLUE SHIELD | Admitting: Internal Medicine

## 2017-04-03 ENCOUNTER — Other Ambulatory Visit (HOSPITAL_COMMUNITY)
Admission: RE | Admit: 2017-04-03 | Discharge: 2017-04-03 | Disposition: A | Discharge status: Home / Self Care | Payer: BLUE CROSS/BLUE SHIELD | Source: Ambulatory Visit | Attending: Internal Medicine | Admitting: Internal Medicine

## 2017-04-03 VITALS — BP 116/72 | HR 103 | Temp 98.4°F | Ht 65.0 in | Wt 219.4 lb

## 2017-04-03 DIAGNOSIS — J069 Acute upper respiratory infection, unspecified: Secondary | ICD-10-CM

## 2017-04-03 DIAGNOSIS — N926 Irregular menstruation, unspecified: Secondary | ICD-10-CM | POA: Diagnosis not present

## 2017-04-03 DIAGNOSIS — G479 Sleep disorder, unspecified: Secondary | ICD-10-CM

## 2017-04-03 DIAGNOSIS — Z0000 Encounter for general adult medical examination without abnormal findings: Secondary | ICD-10-CM

## 2017-04-03 DIAGNOSIS — Z23 Encounter for immunization: Secondary | ICD-10-CM | POA: Diagnosis not present

## 2017-04-03 DIAGNOSIS — Z6835 Body mass index (BMI) 35.0-35.9, adult: Secondary | ICD-10-CM | POA: Diagnosis not present

## 2017-04-03 DIAGNOSIS — Z01419 Encounter for gynecological examination (general) (routine) without abnormal findings: Secondary | ICD-10-CM

## 2017-04-03 DIAGNOSIS — H02402 Unspecified ptosis of left eyelid: Secondary | ICD-10-CM

## 2017-04-03 MED ORDER — METFORMIN HCL ER 500 MG PO TB24: 500.0000 mg | ORAL_TABLET | Freq: Every day | ORAL | 1 refills | Status: DC

## 2017-04-03 NOTE — Patient Instructions (Signed)
Your gyne exam is good today  Expect the uri cold to get better  On its own  fyu if fever  Shortness of breath . Or not improving in anotther week.   Flu vaccine ok today . Will notify you when pap results are available. You can take the metformin once a day .  Healthy weight loss to prevent diabetes .       Preventive Care for Kaylee Holmes, Female The transition to life after high school as a young adult can be a stressful time with many changes. You may start seeing a primary care physician instead of a pediatrician. This is the time when your health care becomes your responsibility. Preventive care refers to lifestyle choices and visits with your health care provider that can promote health and wellness. What does preventive care include?  A yearly physical exam. This is also called an annual wellness visit.  Dental exams once or twice a year.  Routine eye exams. Ask your health care provider how often you should have your eyes checked.  Personal lifestyle choices, including: ? Daily care of your teeth and gums. ? Regular physical activity. ? Eating a healthy diet. ? Avoiding tobacco and drug use. ? Avoiding or limiting alcohol use. ? Practicing safe sex. ? Taking vitamin and mineral supplements as recommended by your health care provider. What happens during an annual wellness visit? Preventive care starts with a yearly visit to your primary care physician. The services and screenings done by your health care provider during your annual wellness visit will depend on your overall health, lifestyle risk factors, and family history of disease. Counseling Your health care provider may ask you questions about:  Past medical problems and your family's medical history.  Medicines or supplements you take.  Health insurance and access to health care.  Alcohol, tobacco, and drug use.  Your safety at home, work, or school.  Access to firearms.  Emotional well-being and how  you cope with stress.  Relationship well-being.  Diet, exercise, and sleep habits.  Your sexual health and activity.  Your methods of birth control.  Your menstrual cycle.  Your pregnancy history.  Screening You may have the following tests or measurements:  Height, weight, and BMI.  Blood pressure.  Lipid and cholesterol levels.  Tuberculosis skin test.  Skin exam.  Vision and hearing tests.  Screening test for hepatitis.  Screening tests for sexually transmitted diseases (STDs), if you are at risk.  BRCA-related cancer screening. This may be done if you have a family history of breast, ovarian, tubal, or peritoneal cancers.  Pelvic exam and Pap test. This may be done every 3 years starting at age 18.  Vaccines Your health care provider may recommend certain vaccines, such as:  Influenza vaccine. This is recommended every year.  Tetanus, diphtheria, and acellular pertussis (Tdap, Td) vaccine. You may need a Td booster every 10 years.  Varicella vaccine. You may need this if you have not been vaccinated.  HPV vaccine. If you are 74 or younger, you may need three doses over 6 months.  Measles, mumps, and rubella (MMR) vaccine. You may need at least one dose of MMR. You may also need a second dose.  Pneumococcal 13-valent conjugate (PCV13) vaccine. You may need this if you have certain conditions and were not previously vaccinated.  Pneumococcal polysaccharide (PPSV23) vaccine. You may need one or two doses if you smoke cigarettes or if you have certain conditions.  Meningococcal vaccine. One dose is  recommended if you are age 38-21 years and a first-year college student living in a residence hall, or if you have one of several medical conditions. You may also need additional booster doses.  Hepatitis A vaccine. You may need this if you have certain conditions or if you travel or work in places where you may be exposed to hepatitis A.  Hepatitis B vaccine. You  may need this if you have certain conditions or if you travel or work in places where you may be exposed to hepatitis B.  Haemophilus influenzae type b (Hib) vaccine. You may need this if you have certain risk factors.  Talk to your health care provider about which screenings and vaccines you need and how often you need them. What steps can I take to develop healthy behaviors?  Have regular preventive health care visits with your primary care physician and dentist.  Eat a healthy diet.  Drink enough fluid to keep your urine clear or pale yellow.  Stay active. Exercise at least 30 minutes 5 or more days of the week.  Use alcohol responsibly.  Maintain a healthy weight.  Do not use any products that contain nicotine, such as cigarettes, chewing tobacco, and e-cigarettes. If you need help quitting, ask your health care provider.  Do not use drugs.  Practice safe sex.  Use birth control (contraception) to prevent unwanted pregnancy. If you plan to become pregnant, see your health care provider for a pre-conception visit.  Find healthy ways to manage stress. How can I protect myself from injury? Injuries from violence or accidents are the leading cause of death among young adults and can often be prevented. Take these steps to help protect yourself:  Always wear your seat belt while driving or riding in a vehicle.  Do not drive if you have been drinking alcohol. Do not ride with someone who has been drinking.  Do not drive when you are tired or distracted. Do not text while driving.  Wear a helmet and other protective equipment during sports activities.  If you have firearms in your house, make sure you follow all gun safety procedures.  Seek help if you have been bullied, physically abused, or sexually abused.  Use the Internet responsibly to avoid dangers such as online bullying and online sexual predators.  What can I do to cope with stress? Young adults may face many new  challenges that can be stressful, such as finding a job, going to college, moving away from home, managing money, being in a relationship, getting married, and having children. To manage stress:  Avoid known stressful situations when you can.  Exercise regularly.  Find a stress-reducing activity that works best for you. Examples include meditation, yoga, listening to music, or reading.  Spend time in nature.  Keep a journal to write about your stress and how you respond.  Talk to your health care provider about stress. He or she may suggest counseling.  Spend time with supportive friends or family.  Do not cope with stress by: ? Drinking alcohol or using drugs. ? Smoking cigarettes. ? Eating.  Where can I get more information? Learn more about preventive care and healthy habits from:  [place] and Gynecologists: KaraokeExchange.nl  U.S. Probation officer Task Force: StageSync.si  National Adolescent and [place]: StrategicRoad.nl  American Academy of Pediatrics Bright Futures: https://brightfutures.MemberVerification.co.za  Society for Adolescent Health and Medicine: MoralBlog.co.za.aspx  PodExchange.nl: ToyLending.fr  This information is not intended to replace advice  given to you by your health care provider. Make sure you discuss any questions you have with your health care provider. Document Released: 09/09/2015 Document Revised: 09/30/2015 Document Reviewed: 09/09/2015 Elsevier Interactive Patient Education  2017 [employer].

## 2017-04-05 LAB — CYTOLOGY - PAP: DIAGNOSIS: NEGATIVE

## 2017-04-06 NOTE — Progress Notes (Signed)
Tell patient PAP is normal.

## 2017-04-10 ENCOUNTER — Telehealth: Payer: Self-pay | Admitting: Internal Medicine

## 2017-04-10 NOTE — Telephone Encounter (Signed)
error 

## 2017-09-28 NOTE — Progress Notes (Signed)
Chief Complaint  Patient presents with  . Follow-up    no new concerns    HPI: Kaylee Holmes 26 y.o. come in for Chronic disease management  Insulin resistance  Periods    3-4   Per month  Metformin once  A day  hardt to take  Twice a day .   Sweet tea   Uses water   In day   Diet green tea.   Sleep  About 4-5   hardt o fall asleep.   Exercise moving all day long.  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    doiong ok  ROS: See pertinent positives and negatives per HPI.  Cough for a while no fever   Past Medical History:  Diagnosis Date  . Chicken pox   . Diabetes mellitus    "pre diabetic"  . 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 Obesity   . Thumb fracture    left  . Varicella     Family History  Problem Relation Age of Onset  . Ulcerative colitis Father   . Rashes / Skin problems Father        psoriasis  . Sarcoidosis Father        skin  . Asthma Other   . Allergies Other   . Rashes / Skin problems Other        aczema  . Diabetes Other   . Bipolar disorder Mother   . Bipolar disorder Sister     Social History   Socioeconomic History  . Marital status: Single    Spouse name: Not on file  . Number of children: Not on file  . Years of education: Not on file  . Highest education level: Not on file  Occupational History  . Not on file  Social Needs  . Financial resource strain: Not on file  . Food insecurity:    Worry: Not on file    Inability: Not on file  . Transportation needs:    Medical: Not on file    Non-medical: Not on file  Tobacco Use  . Smoking status: Light Tobacco Smoker  . Smokeless tobacco: Never Used  Substance and Sexual Activity  . Alcohol use: No  . Drug use: No  . Sexual activity: Yes    Birth control/protection: None  Lifestyle  . Physical activity:    Days per week: Not on file    Minutes per session: Not on file  . Stress: Not on file    Relationships  . Social connections:    Talks on phone: Not on file    Gets together: Not on file    Attends religious service: Not on file    Active member of club or organization: Not on file    Attends meetings of clubs or organizations: Not on file    Relationship status: Not on file  Other Topics Concern  . Not on file  Social History Narrative   Neg tad   Parents Kaylee Holmes and Kaylee Holmes   Kaylee Holmes of 4 no pets. No ets   Plans college in IT      Hs grad  [employer] hour [employer]    Limited exercise from back      Mom's cell number [phone]   G0P0   Neg ad neg fa     Outpatient Medications Prior to Visit  Medication Sig Dispense  Refill  . metFORMIN (GLUCOPHAGE-XR) 500 MG 24 hr tablet Take 1 tablet (500 mg total) by mouth daily with breakfast. 90 tablet 1   No facility-administered medications prior to visit.      EXAM:  BP 96/68 (BP Location: Right Arm, Patient Position: Sitting, Cuff Size: Large)   Pulse 96   Temp 98.3 F (36.8 C) (Oral)   Wt 230 lb 9.6 oz (104.6 kg)   BMI 38.37 kg/m   Body mass index is 38.37 kg/m.  GENERAL: vitals reviewed and listed above, alert, oriented, appears well hydrated and in no acute distress HEENT: atraumatic, conjunctiva  clear, no obvious abnormalities on inspection of external nose and ears  NECK: no obvious masses on inspection palpation  LUNGS: clear to auscultation bilaterally, no wheezes, rales or rhonchi, good air movement CV: HRRR, no clubbing cyanosis or  peripheral edema nl cap refill  Abdomen:  Sof,t normal bowel sounds without hepatosplenomegaly, no guarding rebound or masses no CVA tenderness MS: moves all extremities without noticeable focal  abnormality PSYCH: pleasant and cooperative, no obvious depression or anxiety Lab Results  Component Value Date   WBC 7.0 10/02/2017   HGB 12.2 10/02/2017   HCT 37.3 10/02/2017   PLT 374.0 10/02/2017   GLUCOSE 88 10/02/2017   CHOL 130 10/02/2017   TRIG 56.0 10/02/2017   HDL 35.80  (L) 10/02/2017   LDLCALC 83 10/02/2017   ALT 9 10/02/2017   AST 12 10/02/2017   NA 137 10/02/2017   K 4.4 10/02/2017   CL 103 10/02/2017   CREATININE 0.54 10/02/2017   BUN 9 10/02/2017   CO2 27 10/02/2017   TSH 0.76 08/31/2016   HGBA1C 5.5 10/02/2017   BP Readings from Last 3 Encounters:  10/02/17 96/68  04/03/17 116/72  08/30/16 98/78    ASSESSMENT AND PLAN:  Discussed the following assessment and plan:  Insulin resistance - Plan: Basic metabolic panel, CBC with Differential/Platelet, Hemoglobin A1c, Hepatic function panel, Lipid panel  Medication management - Plan: Basic metabolic panel, CBC with Differential/Platelet, Hemoglobin A1c, Hepatic function panel, Lipid panel  BMI 35.0-35.9,adult - Plan: Basic metabolic panel, CBC with Differential/Platelet, Hemoglobin A1c, Hepatic function panel, Lipid panel  Anemia, unspecified type - Plan: Basic metabolic panel, CBC with Differential/Platelet, Hemoglobin A1c, Hepatic function panel, Lipid panel Counseled. About   Healthy eating  -Patient advised to return or notify health care team  if  new concerns arise.  Patient Instructions    Wt Readings from Last 3 Encounters:  10/02/17 230 lb 9.6 oz (104.6 kg)  04/03/17 219 lb 6.4 oz (99.5 kg)  08/30/16 217 lb 6.4 oz (98.6 kg)   Change to whole fruit for snack  avoiding processed carbs.    Will notify you  of labs when available.   Depending  on labs   cpx in 6 months     [name]. Panosh M.D.

## 2017-10-02 ENCOUNTER — Encounter: Payer: Self-pay | Admitting: Internal Medicine

## 2017-10-02 ENCOUNTER — Ambulatory Visit (INDEPENDENT_AMBULATORY_CARE_PROVIDER_SITE_OTHER): Payer: BLUE CROSS/BLUE SHIELD | Admitting: Internal Medicine

## 2017-10-02 VITALS — BP 96/68 | HR 96 | Temp 98.3°F | Wt 230.6 lb

## 2017-10-02 DIAGNOSIS — Z6835 Body mass index (BMI) 35.0-35.9, adult: Secondary | ICD-10-CM

## 2017-10-02 DIAGNOSIS — D649 Anemia, unspecified: Secondary | ICD-10-CM | POA: Diagnosis not present

## 2017-10-02 DIAGNOSIS — Z79899 Other long term (current) drug therapy: Secondary | ICD-10-CM | POA: Diagnosis not present

## 2017-10-02 DIAGNOSIS — E8881 Metabolic syndrome: Secondary | ICD-10-CM | POA: Diagnosis not present

## 2017-10-02 LAB — HEPATIC FUNCTION PANEL
ALBUMIN: 3.8 g/dL (ref 3.5–5.2)
ALK PHOS: 55 U/L (ref 39–117)
ALT: 9 U/L (ref 0–35)
AST: 12 U/L (ref 0–37)
BILIRUBIN DIRECT: 0.1 mg/dL (ref 0.0–0.3)
BILIRUBIN TOTAL: 0.4 mg/dL (ref 0.2–1.2)
Total Protein: 7.1 g/dL (ref 6.0–8.3)

## 2017-10-02 LAB — BASIC METABOLIC PANEL
BUN: 9 mg/dL (ref 6–23)
CALCIUM: 9.2 mg/dL (ref 8.4–10.5)
CO2: 27 mEq/L (ref 19–32)
CREATININE: 0.54 mg/dL (ref 0.40–1.20)
Chloride: 103 mEq/L (ref 96–112)
GFR: 176.02 mL/min (ref >60.00)
GLUCOSE: 88 mg/dL (ref 70–99)
Potassium: 4.4 mEq/L (ref 3.5–5.1)
Sodium: 137 mEq/L (ref 135–145)

## 2017-10-02 LAB — CBC WITH DIFFERENTIAL/PLATELET
BASOS ABS: 0 10*3/uL (ref 0.0–0.1)
Basophils Relative: 0.6 % (ref 0.0–3.0)
EOS ABS: 0.1 10*3/uL (ref 0.0–0.7)
Eosinophils Relative: 1.8 % (ref 0.0–5.0)
HCT: 37.3 % (ref 36.0–46.0)
Hemoglobin: 12.2 g/dL (ref 12.0–15.0)
LYMPHS ABS: 2.2 10*3/uL (ref 0.7–4.0)
Lymphocytes Relative: 31 % (ref 12.0–46.0)
MCHC: 32.7 g/dL (ref 30.0–36.0)
MCV: 86.6 fl (ref 78.0–100.0)
MONO ABS: 0.5 10*3/uL (ref 0.1–1.0)
Monocytes Relative: 7.4 % (ref 3.0–12.0)
NEUTROS PCT: 59.2 % (ref 43.0–77.0)
Neutro Abs: 4.1 10*3/uL (ref 1.4–7.7)
Platelets: 374 10*3/uL (ref 150.0–400.0)
RBC: 4.31 Mil/uL (ref 3.87–5.11)
RDW: 16 % — ABNORMAL HIGH (ref 11.5–15.5)
WBC: 7 10*3/uL (ref 4.0–10.5)

## 2017-10-02 LAB — LIPID PANEL
Cholesterol: 130 mg/dL (ref 0–200)
HDL: 35.8 mg/dL — AB (ref >39.00)
LDL CALC: 83 mg/dL (ref 0–99)
NONHDL: 94.38
Total CHOL/HDL Ratio: 4
Triglycerides: 56 mg/dL (ref 0.0–149.0)
VLDL: 11.2 mg/dL (ref 0.0–40.0)

## 2017-10-02 LAB — HEMOGLOBIN A1C: HEMOGLOBIN A1C: 5.5 % (ref 4.6–6.5)

## 2017-10-02 MED ORDER — METFORMIN HCL ER 500 MG PO TB24: 500.0000 mg | ORAL_TABLET | Freq: Every day | ORAL | 3 refills | Status: AC

## 2017-10-02 NOTE — Patient Instructions (Signed)
  Wt Readings from Last 3 Encounters:  10/02/17 230 lb 9.6 oz (104.6 kg)  04/03/17 219 lb 6.4 oz (99.5 kg)  08/30/16 217 lb 6.4 oz (98.6 kg)   Change to whole fruit for snack  avoiding processed carbs.    Will notify you  of labs when available.   Depending  on labs   cpx in 6 months

## 2018-03-29 NOTE — Progress Notes (Deleted)
No chief complaint on file.   HPI: Patient  Kaylee Holmes  27 y.o. comes in today for Preventive Health Care visit  And Chronic disease management   Health Maintenance  Topic Date Due  . INFLUENZA VACCINE  12/06/2017  . PAP SMEAR  04/03/2020  . TETANUS/TDAP  07/11/2025  . HIV Screening  Completed   Health Maintenance Review LIFESTYLE:  Exercise:   Tobacco/ETS: Alcohol:  Sugar beverages: Sleep: Drug use: no HH of  Work:    ROS:  GEN/ HEENT: No fever, significant weight changes sweats headaches vision problems hearing changes, CV/ PULM; No chest pain shortness of breath cough, syncope,edema  change in exercise tolerance. GI /GU: No adominal pain, vomiting, change in bowel habits. No blood in the stool. No significant GU symptoms. SKIN/HEME: ,no acute skin rashes suspicious lesions or bleeding. No lymphadenopathy, nodules, masses.  NEURO/ PSYCH:  No neurologic signs such as weakness numbness. No depression anxiety. IMM/ Allergy: No unusual infections.  Allergy .   REST of 12 system review negative except as per HPI   Past Medical History:  Diagnosis Date  . Chicken pox   . Diabetes mellitus    "pre diabetic"  . 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 Obesity   . Thumb fracture    left  . Varicella     Past Surgical History:  Procedure Laterality Date  . SPINAL FUSION  2010    Family History  Problem Relation Age of Onset  . Ulcerative colitis Father   . Rashes / Skin problems Father        psoriasis  . Sarcoidosis Father        skin  . Asthma Other   . Allergies Other   . Rashes / Skin problems Other        aczema  . Diabetes Other   . Bipolar disorder Mother   . Bipolar disorder Sister     Social History   Socioeconomic History  . Marital status: Single    Spouse name: Not on file  . Number of children: Not on file  . Years of education: Not on file  . Highest education level: Not on file    Occupational History  . Not on file  Social Needs  . Financial resource strain: Not on file  . Food insecurity:    Worry: Not on file    Inability: Not on file  . Transportation needs:    Medical: Not on file    Non-medical: Not on file  Tobacco Use  . Smoking status: Light Tobacco Smoker  . Smokeless tobacco: Never Used  Substance and Sexual Activity  . Alcohol use: No  . Drug use: No  . Sexual activity: Yes    Birth control/protection: None  Lifestyle  . Physical activity:    Days per week: Not on file    Minutes per session: Not on file  . Stress: Not on file  Relationships  . Social connections:    Talks on phone: Not on file    Gets together: Not on file    Attends religious service: Not on file    Active member of club or organization: Not on file    Attends meetings of clubs or organizations: Not on file    Relationship status: Not on file  Other Topics Concern  . Not on file  Social History Narrative   Neg tad   Parents Cassandra and Ethelene Brownsnthony  HH of 4 no pets. No ets   Plans college in IT      Hs grad  [employer] hour [employer]    Limited exercise from back      Mom's cell number [phone]   G0P0   Neg ad neg fa     Outpatient Medications Prior to Visit  Medication Sig Dispense Refill  . metFORMIN (GLUCOPHAGE-XR) 500 MG 24 hr tablet Take 1 tablet (500 mg total) by mouth daily with breakfast. 90 tablet 3   No facility-administered medications prior to visit.      EXAM:  There were no vitals taken for this visit.  There is no height or weight on file to calculate BMI. Wt Readings from Last 3 Encounters:  10/02/17 230 lb 9.6 oz (104.6 kg)  04/03/17 219 lb 6.4 oz (99.5 kg)  08/30/16 217 lb 6.4 oz (98.6 kg)    Physical Exam: Vital signs reviewed [id number] is a well-developed well-nourished alert cooperative    who appearsr stated age in no acute distress.  HEENT: normocephalic atraumatic , Eyes: PERRL EOM's full, conjunctiva clear, Nares: paten,t no  deformity discharge or tenderness., Ears: no deformity EAC's clear TMs with normal landmarks. Mouth: clear OP, no lesions, edema.  Moist mucous membranes. Dentition in adequate repair. NECK: supple without masses, thyromegaly or bruits. CHEST/PULM:  Clear to auscultation and percussion breath sounds equal no wheeze , rales or rhonchi. No chest wall deformities or tenderness. Breast: normal by inspection . No dimpling, discharge, masses, tenderness or discharge . CV: PMI is nondisplaced, S1 S2 no gallops, murmurs, rubs. Peripheral pulses are full without delay.No JVD .  ABDOMEN: Bowel sounds normal nontender  No guard or rebound, no hepato splenomegal no CVA tenderness.  No hernia. Extremtities:  No clubbing cyanosis or edema, no acute joint swelling or redness no focal atrophy NEURO:  Oriented x3, cranial nerves 3-12 appear to be intact, no obvious focal weakness,gait within normal limits no abnormal reflexes or asymmetrical SKIN: No acute rashes normal turgor, color, no bruising or petechiae. PSYCH: Oriented, good eye contact, no obvious depression anxiety, cognition and judgment appear normal. LN: no cervical axillary inguinal adenopathy  Lab Results  Component Value Date   WBC 7.0 10/02/2017   HGB 12.2 10/02/2017   HCT 37.3 10/02/2017   PLT 374.0 10/02/2017   GLUCOSE 88 10/02/2017   CHOL 130 10/02/2017   TRIG 56.0 10/02/2017   HDL 35.80 (L) 10/02/2017   LDLCALC 83 10/02/2017   ALT 9 10/02/2017   AST 12 10/02/2017   NA 137 10/02/2017   K 4.4 10/02/2017   CL 103 10/02/2017   CREATININE 0.54 10/02/2017   BUN 9 10/02/2017   CO2 27 10/02/2017   TSH 0.76 08/31/2016   HGBA1C 5.5 10/02/2017    BP Readings from Last 3 Encounters:  10/02/17 96/68  04/03/17 116/72  08/30/16 98/78    Lab results reviewed with patient   ASSESSMENT AND PLAN:  Discussed the following assessment and plan:  No diagnosis found.  Patient Care Team: Madelin Headings, MD as PCP - General Sharol Given, PA-C (Inactive) as Physician Assistant Shirlean Kelly, MD (Neurosurgery) Frederico Hamman, MD (Orthopedic Surgery) There are no Patient Instructions on file for this visit.  [name]. Uriyah Massimo M.D.

## 2018-04-01 ENCOUNTER — Ambulatory Visit: Payer: BLUE CROSS/BLUE SHIELD | Admitting: Internal Medicine

## 2018-04-01 DIAGNOSIS — Z0289 Encounter for other administrative examinations: Secondary | ICD-10-CM

## 2018-04-05 ENCOUNTER — Ambulatory Visit: Payer: BLUE CROSS/BLUE SHIELD | Admitting: Internal Medicine

## 2018-05-27 NOTE — Progress Notes (Signed)
Chief Complaint  Patient presents with  . Annual Exam    Growth on left forehead x 2-3 months. Pt states that she is having pain now and its getting bigger over the last month.     HPI: Patient  Kaylee Holmes  28 y.o. comes in today for Preventive Health Care visit   Heavy periods   Every 2 month   t Cramps  4-5  No card lung  Using  Dads inhaler .  Randomly change  Cough fits.   Twice a week if needed.  Taking metformin once a day   Health Maintenance  Topic Date Due  . INFLUENZA VACCINE  12/06/2017  . PAP-Cervical Cytology Screening  04/03/2020  . PAP SMEAR-Modifier  04/03/2020  . TETANUS/TDAP  07/11/2025  . HIV Screening  Completed   Health Maintenance Review LIFESTYLE:  Exercise:   Move at work  But no formal  Tobacco/ETS:  Slow downd  Cigars.  No vaping  Alcohol: no Sugar beverages: sweet tea 1-2 per day  Sleep: 3-4  Hours .  Cant sleep  Drug use: no HH of   4  No pets  Work: 40   [employer] afternoon evening and day     ROS:  See hpi  No recent risk sti  GEN/ HEENT: No fever, significant weight changes sweats headaches vision problems hearing changes, CV/ PULM; No chest pain shortness of breath cough, syncope,edema  change in exercise tolerance. GI /GU: No adominal pain, vomiting, change in bowel habits. No blood in the stool. No significant GU symptoms. SKIN/HEME: ,no acute skin rashes suspicious lesions or bleeding. No lymphadenopathy, nodules, masses.  NEURO/ PSYCH:  No neurologic signs such as weakness numbness. No depression anxiety. IMM/ Allergy: No unusual infections.  Allergy .   REST of 12 system review negative except as per HPI   Past Medical History:  Diagnosis Date  . Chicken pox   . Diabetes mellitus    "pre diabetic"  . 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 Obesity   . Thumb fracture    left  . Varicella     Past Surgical History:  Procedure Laterality Date  . SPINAL FUSION  2010     Family History  Problem Relation Age of Onset  . Ulcerative colitis Father   . Rashes / Skin problems Father        psoriasis  . Sarcoidosis Father        skin  . Asthma Other   . Allergies Other   . Rashes / Skin problems Other        aczema  . Diabetes Other   . Bipolar disorder Mother   . Bipolar disorder Sister     Social History   Socioeconomic History  . Marital status: Single    Spouse name: Not on file  . Number of children: Not on file  . Years of education: Not on file  . Highest education level: Not on file  Occupational History  . Not on file  Social Needs  . Financial resource strain: Not on file  . Food insecurity:    Worry: Not on file    Inability: Not on file  . Transportation needs:    Medical: Not on file    Non-medical: Not on file  Tobacco Use  . Smoking status: Light Tobacco Smoker  . Smokeless tobacco: Never Used  Substance and Sexual Activity  . Alcohol use: No  .  Drug use: No  . Sexual activity: Yes    Birth control/protection: None  Lifestyle  . Physical activity:    Days per week: Not on file    Minutes per session: Not on file  . Stress: Not on file  Relationships  . Social connections:    Talks on phone: Not on file    Gets together: Not on file    Attends religious service: Not on file    Active member of club or organization: Not on file    Attends meetings of clubs or organizations: Not on file    Relationship status: Not on file  Other Topics Concern  . Not on file  Social History Narrative   Neg tad   Parents Cassandra and Elberta Fortis   [facility] of [facility] no pets. No ets   Plans college in IT      [employer] grad  40 hour [employer]    Limited exercise from back      Mom's cell number [phone]   [id number]   Neg ad neg fa     Outpatient Medications Prior to Visit  Medication Sig Dispense Refill  . metFORMIN (GLUCOPHAGE-XR) 500 MG 24 hr tablet Take 1 tablet (500 mg total) by mouth daily with breakfast. 90 tablet 3   No  facility-administered medications prior to visit.      EXAM:  BP 102/78 (BP Location: Right Arm, Patient Position: Sitting, Cuff Size: Large)   Pulse 94   Temp 98.5 F (36.9 C) (Oral)   Ht 5' 5.75" (1.67 m)   Wt 229 lb 12.8 oz (104.2 kg)   LMP 05/27/2018 (Exact Date)   BMI 37.38 kg/m   Body mass index is 37.38 kg/m. Wt Readings from Last 3 Encounters:  05/28/18 229 lb 12.8 oz (104.2 kg)  10/02/17 230 lb 9.6 oz (104.6 kg)  04/03/17 219 lb 6.4 oz (99.5 kg)    Physical Exam: Vital signs reviewed XLK:GMWN is a well-developed well-nourished alert cooperative    who appearsr stated age in no acute distress.  HEENT: normocephalic atraumatic   With lesion left forehead , Eyes: PERRL EOM's full, conjunctiva clear, Nares: paten,t no deformity discharge or tenderness., Ears: no deformity EAC's clear TMs with normal landmarks. Mouth: clear OP, no lesions, edema.  Moist mucous membranes. Dentition in adequate repair. NECK: supple without masses, thyromegaly or bruits. CHEST/PULM:  Clear to auscultation and percussion breath sounds equal no wheeze , rales or rhonchi. No chest wall deformities or tenderness. Breast: normal by inspection . No dimpling, discharge, masses, tenderness or discharge . CV: PMI is nondisplaced, S1 S2 no gallops, murmurs, rubs. Peripheral pulses are full without delay.No JVD .  ABDOMEN: Bowel sounds normal nontender  No guard or rebound, no hepato splenomegal no CVA tenderness.  N Extremtities:  No clubbing cyanosis or edema, no acute joint swelling or redness no focal atrophy NEURO:  Oriented x3, cranial nerves 3-12 appear to be intact, no obvious focal weakness,gait within normal limits no abnormal reflexes or asymmetrical SKIN: No acute rashes normal turgor, color, no bruising or petechiae.  3+ mm horn type lesion with  Pink base  Taller than wider.   Verrucous looking  Left forehead  Not bleeding  PSYCH: Oriented, good eye contact, no obvious depression anxiety,  cognition and judgment appear normal. LN: no cervical axillary inguinal adenopathy  Lab Results  Component Value Date   WBC 9.0 05/28/2018   HGB 12.7 05/28/2018   HCT 38.5 05/28/2018   PLT 360.0 05/28/2018  GLUCOSE 91 05/28/2018   CHOL 165 05/28/2018   TRIG 74.0 05/28/2018   HDL 34.20 (L) 05/28/2018   LDLCALC 116 (H) 05/28/2018   ALT 10 05/28/2018   AST 15 05/28/2018   NA 139 05/28/2018   K 4.2 05/28/2018   CL 102 05/28/2018   CREATININE 0.65 05/28/2018   BUN 9 05/28/2018   CO2 29 05/28/2018   TSH 0.50 05/28/2018   HGBA1C 5.4 05/28/2018    BP Readings from Last 3 Encounters:  05/28/18 102/78  10/02/17 96/68  04/03/17 116/72    Lab results reviewed with patient   ASSESSMENT AND PLAN:  Discussed the following assessment and plan:  Visit for preventive health examination - Plan: Basic metabolic panel, CBC with Differential/Platelet, Hemoglobin A1c, Hepatic function panel, Lipid panel, TSH  Insulin resistance - Plan: Basic metabolic panel, CBC with Differential/Platelet, Hemoglobin A1c, Hepatic function panel, Lipid panel, TSH  Low HDL (under 40) - Plan: Basic metabolic panel, CBC with Differential/Platelet, Hemoglobin A1c, Hepatic function panel, Lipid panel, TSH  History of tobacco use  Medication management - Plan: Basic metabolic panel, CBC with Differential/Platelet, Hemoglobin A1c, Hepatic function panel, Lipid panel, TSH  BMI 35.0-35.9,adult - Plan: Basic metabolic panel, CBC with Differential/Platelet, Hemoglobin A1c, Hepatic function panel, Lipid panel, TSH  Cutaneous horn ? - forehead   plan derm for removal cause of location and view.  - Plan: Ambulatory referral to Dermatology  Need for influenza vaccination - Plan: Flu Vaccine QUAD 6+ mos PF IM (Fluarix Quad PF) Sweet tea  Tobacco barriers    Counseled.  lesion on forehead  Would like  Derm to remove because of  Location  And rapid growth  Horn vs warty   Inhaler use   And fu if needed  peristent Patient Care Team: Jady Braggs, Standley Brooking, MD as PCP - General Modena Jansky, PA-C (Inactive) as Physician Assistant Jovita Gamma, MD (Neurosurgery) Earlie Server, MD (Orthopedic Surgery) Patient Instructions  Intensify  attentionlifestyle interventions.   Cut out limit calories in  Beverages.  TRACK all you eat and drink and times   Weight  Once a week or more in am  If you track  You are more likely to change what you do for the better .   Decrease portion size water instead of other  Intake.   Avoiding processed carbohydrates  Such as chips pretzels rice  [place] fries  breads and sweets etc. Whole furits and vegges are ok.   Will send in one inhaler  But if having to use more than 2 days per week plan fu and consider other evalution  Controller med or evaluation.      Preventing Unhealthy Weight Gain, Adult Staying at a healthy weight is important to your overall health. When fat builds up in your body, you may become overweight or obese. Being overweight or obese increases your risk of developing certain health problems, such as heart disease, diabetes, sleeping problems, joint problems, and some types of cancer. Unhealthy weight gain is often the result of making unhealthy food choices or not getting enough exercise. You can make changes to your lifestyle to prevent obesity and stay as healthy as possible. What nutrition changes can be made?   Eat only as much as your body needs. To do this: ? Pay attention to signs that you are hungry or full. Stop eating as soon as you feel full. ? If you feel hungry, try drinking water first before eating. Drink enough water so your urine is  clear or pale yellow. ? Eat smaller portions. Pay attention to portion sizes when eating out. ? Look at serving sizes on food labels. Most foods contain more than one serving per container. ? Eat the recommended number of calories for your gender and activity level. For most active people, a  daily total of 2,000 calories is appropriate. If you are trying to lose weight or are not very active, you may need to eat fewer calories. Talk with your health care provider or a diet and nutrition specialist (dietitian) about how many calories you need each day.  Choose healthy foods, such as: ? Fruits and vegetables. At each meal, try to fill at least half of your plate with fruits and vegetables. ? Whole grains, such as whole-wheat bread, brown rice, and quinoa. ? Lean meats, such as chicken or fish. ? Other healthy proteins, such as beans, eggs, or tofu. ? Healthy fats, such as nuts, seeds, fatty fish, and olive oil. ? Low-fat or fat-free dairy products.  Check food labels, and avoid food and drinks that: ? Are high in calories. ? Have added sugar. ? Are high in sodium. ? Have saturated fats or trans fats.  Cook foods in healthier ways, such as by baking, broiling, or grilling.  Make a meal plan for the week, and shop with a grocery list to help you stay on track with your purchases. Try to avoid going to the grocery store when you are hungry.  When grocery shopping, try to shop around the outside of the store first, where the fresh foods are. Doing this helps you to avoid prepackaged foods, which can be high in sugar, salt (sodium), and fat. What lifestyle changes can be made?   Exercise for 30 or more minutes on 5 or more days each week. Exercising may include brisk walking, yard work, biking, running, swimming, and team sports like basketball and soccer. Ask your health care provider which exercises are safe for you.  Do muscle-strengthening activities, such as lifting weights or using resistance bands, on 2 or more days a week.  Do not use any products that contain nicotine or tobacco, such as cigarettes and e-cigarettes. If you need help quitting, ask your health care provider.  Limit alcohol intake to no more than 1 drink a day for nonpregnant women and 2 drinks a day for  men. One drink equals 12 oz of beer, 5 oz of wine, or 1 oz of hard liquor.  Try to get 7-9 hours of sleep each night. What other changes can be made?  Keep a food and activity journal to keep track of: ? What you ate and how many calories you had. Remember to count the calories in sauces, dressings, and side dishes. ? Whether you were active, and what exercises you did. ? Your calorie, weight, and activity goals.  Check your weight regularly. Track any changes. If you notice you have gained weight, make changes to your diet or activity routine.  Avoid taking weight-loss medicines or supplements. Talk to your health care provider before starting any new medicine or supplement.  Talk to your health care provider before trying any new diet or exercise plan. Why are these changes important? Eating healthy, staying active, and having healthy habits can help you to prevent obesity. Those changes also:  Help you manage stress and emotions.  Help you connect with friends and family.  Improve your self-esteem.  Improve your sleep.  Prevent long-term health problems. What can happen if changes  are not made? Being obese or overweight can cause you to develop joint or bone problems, which can make it hard for you to stay active or do activities you enjoy. Being obese or overweight also puts stress on your heart and lungs and can lead to health problems like diabetes, heart disease, and some cancers. Where to find more information Talk with your health care provider or a dietitian about healthy eating and healthy lifestyle choices. You may also find information from:  U.[place]. Department of Agriculture, MyPlate: FormerBoss.no  American Heart Association: www.heart.org  Centers for Disease Control and Prevention: http://www.wolf.info/ Summary  Staying at a healthy weight is important to your overall health. It helps you to prevent certain diseases and health problems, such as heart disease,  diabetes, joint problems, sleep disorders, and some types of cancer.  Being obese or overweight can cause you to develop joint or bone problems, which can make it hard for you to stay active or do activities you enjoy.  You can prevent unhealthy weight gain by eating a healthy diet, exercising regularly, not smoking, limiting alcohol, and getting enough sleep.  Talk with your health care provider or a dietitian for guidance about healthy eating and healthy lifestyle choices. This information is not intended to replace advice given to you by your health care provider. Make sure you discuss any questions you have with your health care provider. Document Released: 04/25/2016 Document Revised: 02/02/2017 Document Reviewed: 05/31/2016 Elsevier Interactive Patient Education  2019 [place] 18-39 Years, Female Preventive care refers to lifestyle choices and visits with your health care provider that can promote health and wellness. What does preventive care include?   A yearly physical exam. This is also called an annual well check.  Dental exams once or twice a year.  Routine eye exams. Ask your health care provider how often you should have your eyes checked.  Personal lifestyle choices, including: ? Daily care of your teeth and gums. ? Regular physical activity. ? Eating a healthy diet. ? Avoiding tobacco and drug use. ? Limiting alcohol use. ? Practicing safe sex. ? Taking vitamin and mineral supplements as recommended by your health care provider. What happens during an annual well check? The services and screenings done by your health care provider during your annual well check will depend on your age, overall health, lifestyle risk factors, and family history of disease. Counseling Your health care provider may ask you questions about your:  Alcohol use.  Tobacco use.  Drug use.  Emotional well-being.  Home and relationship well-being.  Sexual  activity.  Eating habits.  Work and work Statistician.  Method of birth control.  Menstrual cycle.  Pregnancy history. Screening You may have the following tests or measurements:  Height, weight, and BMI.  Diabetes screening. This is done by checking your blood sugar (glucose) after you have not eaten for a while (fasting).  Blood pressure.  Lipid and cholesterol levels. These may be checked every 5 years starting at age 29.  Skin check.  Hepatitis C blood test.  Hepatitis B blood test.  Sexually transmitted disease (STD) testing.  BRCA-related cancer screening. This may be done if you have a family history of breast, ovarian, tubal, or peritoneal cancers.  Pelvic exam and Pap test. This may be done every 3 years starting at age 27. Starting at age 13, this may be done every 5 years if you have a Pap test in combination with an HPV test. Discuss your test  results, treatment options, and if necessary, the need for more tests with your health care provider. Vaccines Your health care provider may recommend certain vaccines, such as:  Influenza vaccine. This is recommended every year.  Tetanus, diphtheria, and acellular pertussis (Tdap, Td) vaccine. You may need a Td booster every 10 years.  Varicella vaccine. You may need this if you have not been vaccinated.  HPV vaccine. If you are 42 or younger, you may need three doses over 6 months.  Measles, mumps, and rubella (MMR) vaccine. You may need at least one dose of MMR. You may also need a second dose.  Pneumococcal 13-valent conjugate (PCV13) vaccine. You may need this if you have certain conditions and were not previously vaccinated.  Pneumococcal polysaccharide (PPSV23) vaccine. You may need one or two doses if you smoke cigarettes or if you have certain conditions.  Meningococcal vaccine. One dose is recommended if you are age 47-21 years and a first-year college student living in a residence hall, or if you have one  of several medical conditions. You may also need additional booster doses.  Hepatitis A vaccine. You may need this if you have certain conditions or if you travel or work in places where you may be exposed to hepatitis A.  Hepatitis B vaccine. You may need this if you have certain conditions or if you travel or work in places where you may be exposed to hepatitis B.  Haemophilus influenzae type b (Hib) vaccine. You may need this if you have certain risk factors. Talk to your health care provider about which screenings and vaccines you need and how often you need them. This information is not intended to replace advice given to you by your health care provider. Make sure you discuss any questions you have with your health care provider. Document Released: 06/20/2001 Document Revised: 12/05/2016 Document Reviewed: 02/23/2015 Elsevier Interactive Patient Education  2019 [place]. Neya Creegan M.D.

## 2018-05-28 ENCOUNTER — Ambulatory Visit (INDEPENDENT_AMBULATORY_CARE_PROVIDER_SITE_OTHER): Payer: BLUE CROSS/BLUE SHIELD | Admitting: Internal Medicine

## 2018-05-28 ENCOUNTER — Encounter: Payer: Self-pay | Admitting: Internal Medicine

## 2018-05-28 VITALS — BP 102/78 | HR 94 | Temp 98.5°F | Ht 65.75 in | Wt 229.8 lb

## 2018-05-28 DIAGNOSIS — Z79899 Other long term (current) drug therapy: Secondary | ICD-10-CM | POA: Diagnosis not present

## 2018-05-28 DIAGNOSIS — Z0000 Encounter for general adult medical examination without abnormal findings: Secondary | ICD-10-CM

## 2018-05-28 DIAGNOSIS — Z87891 Personal history of nicotine dependence: Secondary | ICD-10-CM | POA: Diagnosis not present

## 2018-05-28 DIAGNOSIS — L858 Other specified epidermal thickening: Secondary | ICD-10-CM

## 2018-05-28 DIAGNOSIS — E8881 Metabolic syndrome: Secondary | ICD-10-CM

## 2018-05-28 DIAGNOSIS — E786 Lipoprotein deficiency: Secondary | ICD-10-CM | POA: Diagnosis not present

## 2018-05-28 DIAGNOSIS — Z23 Encounter for immunization: Secondary | ICD-10-CM | POA: Diagnosis not present

## 2018-05-28 DIAGNOSIS — Z6835 Body mass index (BMI) 35.0-35.9, adult: Secondary | ICD-10-CM

## 2018-05-28 LAB — HEPATIC FUNCTION PANEL
ALK PHOS: 67 U/L (ref 39–117)
ALT: 10 U/L (ref 0–35)
AST: 15 U/L (ref 0–37)
Albumin: 4.3 g/dL (ref 3.5–5.2)
Bilirubin, Direct: 0.1 mg/dL (ref 0.0–0.3)
Total Bilirubin: 0.4 mg/dL (ref 0.2–1.2)
Total Protein: 7.6 g/dL (ref 6.0–8.3)

## 2018-05-28 LAB — LIPID PANEL
Cholesterol: 165 mg/dL (ref 0–200)
HDL: 34.2 mg/dL — ABNORMAL LOW (ref >39.00)
LDL CALC: 116 mg/dL — AB (ref 0–99)
NonHDL: 130.61
Total CHOL/HDL Ratio: 5
Triglycerides: 74 mg/dL (ref 0.0–149.0)
VLDL: 14.8 mg/dL (ref 0.0–40.0)

## 2018-05-28 LAB — CBC WITH DIFFERENTIAL/PLATELET
BASOS PCT: 0.4 % (ref 0.0–3.0)
Basophils Absolute: 0 10*3/uL (ref 0.0–0.1)
EOS ABS: 0.1 10*3/uL (ref 0.0–0.7)
EOS PCT: 1.1 % (ref 0.0–5.0)
HCT: 38.5 % (ref 36.0–46.0)
HEMOGLOBIN: 12.7 g/dL (ref 12.0–15.0)
Lymphocytes Relative: 31.3 % (ref 12.0–46.0)
Lymphs Abs: 2.8 10*3/uL (ref 0.7–4.0)
MCHC: 33 g/dL (ref 30.0–36.0)
MCV: 88.7 fl (ref 78.0–100.0)
MONO ABS: 0.6 10*3/uL (ref 0.1–1.0)
Monocytes Relative: 6.3 % (ref 3.0–12.0)
NEUTROS ABS: 5.5 10*3/uL (ref 1.4–7.7)
Neutrophils Relative %: 60.9 % (ref 43.0–77.0)
PLATELETS: 360 10*3/uL (ref 150.0–400.0)
RBC: 4.35 Mil/uL (ref 3.87–5.11)
RDW: 15.8 % — AB (ref 11.5–15.5)
WBC: 9 10*3/uL (ref 4.0–10.5)

## 2018-05-28 LAB — HEMOGLOBIN A1C: HEMOGLOBIN A1C: 5.4 % (ref 4.6–6.5)

## 2018-05-28 LAB — BASIC METABOLIC PANEL
BUN: 9 mg/dL (ref 6–23)
CHLORIDE: 102 meq/L (ref 96–112)
CO2: 29 meq/L (ref 19–32)
Calcium: 9.7 mg/dL (ref 8.4–10.5)
Creatinine, Ser: 0.65 mg/dL (ref 0.40–1.20)
GFR: 133.03 mL/min (ref >60.00)
Glucose, Bld: 91 mg/dL (ref 70–99)
POTASSIUM: 4.2 meq/L (ref 3.5–5.1)
Sodium: 139 mEq/L (ref 135–145)

## 2018-05-28 LAB — TSH: TSH: 0.5 u[IU]/mL (ref 0.35–4.50)

## 2018-05-28 MED ORDER — ALBUTEROL SULFATE 108 (90 BASE) MCG/ACT IN AEPB: 1.0000 | INHALATION_SPRAY | Freq: Four times a day (QID) | RESPIRATORY_TRACT | 1 refills | Status: AC | PRN

## 2018-05-28 NOTE — Patient Instructions (Signed)
Intensify  attentionlifestyle interventions.   Cut out limit calories in  Beverages.  TRACK all you eat and drink and times   Weight  Once a week or more in am  If you track  You are more likely to change what you do for the better .   Decrease portion size water instead of other  Intake.   Avoiding processed carbohydrates  Such as chips pretzels rice  [place] fries  breads and sweets etc. Whole furits and vegges are ok.   Will send in one inhaler  But if having to use more than 2 days per week plan fu and consider other evalution  Controller med or evaluation.      Preventing Unhealthy Weight Gain, Adult Staying at a healthy weight is important to your overall health. When fat builds up in your body, you may become overweight or obese. Being overweight or obese increases your risk of developing certain health problems, such as heart disease, diabetes, sleeping problems, joint problems, and some types of cancer. Unhealthy weight gain is often the result of making unhealthy food choices or not getting enough exercise. You can make changes to your lifestyle to prevent obesity and stay as healthy as possible. What nutrition changes can be made?   Eat only as much as your body needs. To do this: ? Pay attention to signs that you are hungry or full. Stop eating as soon as you feel full. ? If you feel hungry, try drinking water first before eating. Drink enough water so your urine is clear or pale yellow. ? Eat smaller portions. Pay attention to portion sizes when eating out. ? Look at serving sizes on food labels. Most foods contain more than one serving per container. ? Eat the recommended number of calories for your gender and activity level. For most active people, a daily total of 2,000 calories is appropriate. If you are trying to lose weight or are not very active, you may need to eat fewer calories. Talk with your health care provider or a diet and nutrition specialist (dietitian) about  how many calories you need each day.  Choose healthy foods, such as: ? Fruits and vegetables. At each meal, try to fill at least half of your plate with fruits and vegetables. ? Whole grains, such as whole-wheat bread, brown rice, and quinoa. ? Lean meats, such as chicken or fish. ? Other healthy proteins, such as beans, eggs, or tofu. ? Healthy fats, such as nuts, seeds, fatty fish, and olive oil. ? Low-fat or fat-free dairy products.  Check food labels, and avoid food and drinks that: ? Are high in calories. ? Have added sugar. ? Are high in sodium. ? Have saturated fats or trans fats.  Cook foods in healthier ways, such as by baking, broiling, or grilling.  Make a meal plan for the week, and shop with a grocery list to help you stay on track with your purchases. Try to avoid going to the grocery store when you are hungry.  When grocery shopping, try to shop around the outside of the store first, where the fresh foods are. Doing this helps you to avoid prepackaged foods, which can be high in sugar, salt (sodium), and fat. What lifestyle changes can be made?   Exercise for 30 or more minutes on 5 or more days each week. Exercising may include brisk walking, yard work, biking, running, swimming, and team sports like basketball and soccer. Ask your health care provider which exercises are  safe for you.  Do muscle-strengthening activities, such as lifting weights or using resistance bands, on 2 or more days a week.  Do not use any products that contain nicotine or tobacco, such as cigarettes and e-cigarettes. If you need help quitting, ask your health care provider.  Limit alcohol intake to no more than 1 drink a day for nonpregnant women and 2 drinks a day for men. One drink equals 12 oz of beer, 5 oz of wine, or 1 oz of hard liquor.  Try to get 7-9 hours of sleep each night. What other changes can be made?  Keep a food and activity journal to keep track of: ? What you ate and how  many calories you had. Remember to count the calories in sauces, dressings, and side dishes. ? Whether you were active, and what exercises you did. ? Your calorie, weight, and activity goals.  Check your weight regularly. Track any changes. If you notice you have gained weight, make changes to your diet or activity routine.  Avoid taking weight-loss medicines or supplements. Talk to your health care provider before starting any new medicine or supplement.  Talk to your health care provider before trying any new diet or exercise plan. Why are these changes important? Eating healthy, staying active, and having healthy habits can help you to prevent obesity. Those changes also:  Help you manage stress and emotions.  Help you connect with friends and family.  Improve your self-esteem.  Improve your sleep.  Prevent long-term health problems. What can happen if changes are not made? Being obese or overweight can cause you to develop joint or bone problems, which can make it hard for you to stay active or do activities you enjoy. Being obese or overweight also puts stress on your heart and lungs and can lead to health problems like diabetes, heart disease, and some cancers. Where to find more information Talk with your health care provider or a dietitian about healthy eating and healthy lifestyle choices. You may also find information from:  [place] Department of Agriculture, MyPlate: FormerBoss.no  American Heart Association: www.heart.org  Centers for Disease Control and Prevention: http://www.wolf.info/ Summary  Staying at a healthy weight is important to your overall health. It helps you to prevent certain diseases and health problems, such as heart disease, diabetes, joint problems, sleep disorders, and some types of cancer.  Being obese or overweight can cause you to develop joint or bone problems, which can make it hard for you to stay active or do activities you enjoy.  You can  prevent unhealthy weight gain by eating a healthy diet, exercising regularly, not smoking, limiting alcohol, and getting enough sleep.  Talk with your health care provider or a dietitian for guidance about healthy eating and healthy lifestyle choices. This information is not intended to replace advice given to you by your health care provider. Make sure you discuss any questions you have with your health care provider. Document Released: 04/25/2016 Document Revised: 02/02/2017 Document Reviewed: 05/31/2016 Elsevier Interactive Patient Education  2019 [place] 18-39 Years, Female Preventive care refers to lifestyle choices and visits with your health care provider that can promote health and wellness. What does preventive care include?   A yearly physical exam. This is also called an annual well check.  Dental exams once or twice a year.  Routine eye exams. Ask your health care provider how often you should have your eyes checked.  Personal lifestyle choices, including: ? Daily care  of your teeth and gums. ? Regular physical activity. ? Eating a healthy diet. ? Avoiding tobacco and drug use. ? Limiting alcohol use. ? Practicing safe sex. ? Taking vitamin and mineral supplements as recommended by your health care provider. What happens during an annual well check? The services and screenings done by your health care provider during your annual well check will depend on your age, overall health, lifestyle risk factors, and family history of disease. Counseling Your health care provider may ask you questions about your:  Alcohol use.  Tobacco use.  Drug use.  Emotional well-being.  Home and relationship well-being.  Sexual activity.  Eating habits.  Work and work Statistician.  Method of birth control.  Menstrual cycle.  Pregnancy history. Screening You may have the following tests or measurements:  Height, weight, and BMI.  Diabetes  screening. This is done by checking your blood sugar (glucose) after you have not eaten for a while (fasting).  Blood pressure.  Lipid and cholesterol levels. These may be checked every 5 years starting at age 61.  Skin check.  Hepatitis C blood test.  Hepatitis B blood test.  Sexually transmitted disease (STD) testing.  BRCA-related cancer screening. This may be done if you have a family history of breast, ovarian, tubal, or peritoneal cancers.  Pelvic exam and Pap test. This may be done every 3 years starting at age 85. Starting at age 57, this may be done every 5 years if you have a Pap test in combination with an HPV test. Discuss your test results, treatment options, and if necessary, the need for more tests with your health care provider. Vaccines Your health care provider may recommend certain vaccines, such as:  Influenza vaccine. This is recommended every year.  Tetanus, diphtheria, and acellular pertussis (Tdap, Td) vaccine. You may need a Td booster every 10 years.  Varicella vaccine. You may need this if you have not been vaccinated.  HPV vaccine. If you are 5 or younger, you may need three doses over 6 months.  Measles, mumps, and rubella (MMR) vaccine. You may need at least one dose of MMR. You may also need a second dose.  Pneumococcal 13-valent conjugate (PCV13) vaccine. You may need this if you have certain conditions and were not previously vaccinated.  Pneumococcal polysaccharide (PPSV23) vaccine. You may need one or two doses if you smoke cigarettes or if you have certain conditions.  Meningococcal vaccine. One dose is recommended if you are age 79-21 years and a first-year college student living in a residence hall, or if you have one of several medical conditions. You may also need additional booster doses.  Hepatitis A vaccine. You may need this if you have certain conditions or if you travel or work in places where you may be exposed to hepatitis  A.  Hepatitis B vaccine. You may need this if you have certain conditions or if you travel or work in places where you may be exposed to hepatitis B.  Haemophilus influenzae type b (Hib) vaccine. You may need this if you have certain risk factors. Talk to your health care provider about which screenings and vaccines you need and how often you need them. This information is not intended to replace advice given to you by your health care provider. Make sure you discuss any questions you have with your health care provider. Document Released: 06/20/2001 Document Revised: 12/05/2016 Document Reviewed: 02/23/2015 Elsevier Interactive Patient Education  2019 [employer].

## 2018-08-01 ENCOUNTER — Encounter: Payer: Self-pay | Admitting: Family

## 2018-08-01 ENCOUNTER — Telehealth: Payer: BLUE CROSS/BLUE SHIELD | Admitting: Family

## 2018-08-01 DIAGNOSIS — R059 Cough, unspecified: Secondary | ICD-10-CM

## 2018-08-01 DIAGNOSIS — R05 Cough: Secondary | ICD-10-CM

## 2018-08-01 MED ORDER — PROMETHAZINE-DM 6.25-15 MG/5ML PO SYRP: 5.0000 mL | ORAL_SOLUTION | Freq: Four times a day (QID) | ORAL | 0 refills | Status: AC | PRN

## 2018-08-01 NOTE — Progress Notes (Signed)

## 2018-08-02 ENCOUNTER — Ambulatory Visit: Payer: Self-pay | Admitting: Internal Medicine

## 2018-08-02 NOTE — Telephone Encounter (Signed)
Had E visit yesterday.  I have questions.  Do I need to stay home?   No fever, cough that is getting better.   I'm having body aches but sore throat went away 2 days ago. Do I need to stay inside?   I let her know she should stay in for the 14 days she was instructed to do by the E visit NP.  She verbalized understanding.   She was wondering about her father.   Should he stay in?   I will access his chart.
